# Patient Record
Sex: Female | Born: 1965
Health system: Southern US, Community
[De-identification: ages and names within clinical notes are randomized; demographics above are authoritative.]

## PROBLEM LIST (undated history)

## (undated) DIAGNOSIS — R87619 Unspecified abnormal cytological findings in specimens from cervix uteri: Secondary | ICD-10-CM

## (undated) DIAGNOSIS — I10 Essential (primary) hypertension: Secondary | ICD-10-CM

## (undated) DIAGNOSIS — N390 Urinary tract infection, site not specified: Secondary | ICD-10-CM

## (undated) DIAGNOSIS — E785 Hyperlipidemia, unspecified: Secondary | ICD-10-CM

## (undated) HISTORY — DX: Unspecified abnormal cytological findings in specimens from cervix uteri: R87.619

## (undated) HISTORY — DX: Hyperlipidemia, unspecified: E78.5

## (undated) HISTORY — PX: BREAST ENHANCEMENT SURGERY: SHX7

## (undated) HISTORY — DX: Essential (primary) hypertension: I10

## (undated) HISTORY — DX: Urinary tract infection, site not specified: N39.0

## (undated) HISTORY — PX: AUGMENTATION MAMMAPLASTY: SUR837

---

## 1994-03-14 DIAGNOSIS — R87619 Unspecified abnormal cytological findings in specimens from cervix uteri: Secondary | ICD-10-CM

## 1994-03-14 HISTORY — PX: CRYOTHERAPY: SHX1416

## 1994-03-14 HISTORY — DX: Unspecified abnormal cytological findings in specimens from cervix uteri: R87.619

## 2005-05-24 ENCOUNTER — Other Ambulatory Visit: Admission: RE | Admit: 2005-05-24 | Discharge: 2005-05-24 | Payer: Self-pay | Admitting: Obstetrics and Gynecology

## 2005-07-01 ENCOUNTER — Encounter: Admission: RE | Admit: 2005-07-01 | Discharge: 2005-07-01 | Payer: Self-pay | Admitting: Obstetrics and Gynecology

## 2006-05-25 ENCOUNTER — Other Ambulatory Visit: Admission: RE | Admit: 2006-05-25 | Discharge: 2006-05-25 | Payer: Self-pay | Admitting: Obstetrics and Gynecology

## 2006-07-04 ENCOUNTER — Encounter: Admission: RE | Admit: 2006-07-04 | Discharge: 2006-07-04 | Payer: Self-pay | Admitting: Obstetrics and Gynecology

## 2007-07-13 ENCOUNTER — Encounter: Admission: RE | Admit: 2007-07-13 | Discharge: 2007-07-13 | Payer: Self-pay | Admitting: Family Medicine

## 2008-07-28 ENCOUNTER — Encounter: Admission: RE | Admit: 2008-07-28 | Discharge: 2008-07-28 | Payer: Self-pay | Admitting: Family Medicine

## 2009-11-10 ENCOUNTER — Encounter: Admission: RE | Admit: 2009-11-10 | Discharge: 2009-11-10 | Payer: Self-pay | Admitting: Obstetrics and Gynecology

## 2010-10-26 ENCOUNTER — Other Ambulatory Visit: Payer: Self-pay | Admitting: Obstetrics and Gynecology

## 2010-10-26 DIAGNOSIS — Z1231 Encounter for screening mammogram for malignant neoplasm of breast: Secondary | ICD-10-CM

## 2010-11-30 ENCOUNTER — Ambulatory Visit: Payer: Self-pay

## 2010-12-21 ENCOUNTER — Ambulatory Visit
Admission: RE | Admit: 2010-12-21 | Discharge: 2010-12-21 | Disposition: A | Discharge status: Home / Self Care | Payer: Self-pay | Source: Ambulatory Visit | Attending: Obstetrics and Gynecology | Admitting: Obstetrics and Gynecology

## 2010-12-21 DIAGNOSIS — Z1231 Encounter for screening mammogram for malignant neoplasm of breast: Secondary | ICD-10-CM

## 2011-01-11 ENCOUNTER — Ambulatory Visit: Payer: Self-pay

## 2011-11-07 ENCOUNTER — Other Ambulatory Visit: Payer: Self-pay | Admitting: Nephrology

## 2011-11-07 DIAGNOSIS — I10 Essential (primary) hypertension: Secondary | ICD-10-CM

## 2011-11-09 ENCOUNTER — Other Ambulatory Visit: Payer: BC Managed Care – PPO

## 2011-11-11 ENCOUNTER — Ambulatory Visit (INDEPENDENT_AMBULATORY_CARE_PROVIDER_SITE_OTHER): Payer: BC Managed Care – PPO

## 2011-11-11 DIAGNOSIS — I10 Essential (primary) hypertension: Secondary | ICD-10-CM

## 2011-11-11 DIAGNOSIS — R799 Abnormal finding of blood chemistry, unspecified: Secondary | ICD-10-CM

## 2011-11-21 ENCOUNTER — Other Ambulatory Visit: Payer: Self-pay | Admitting: Nurse Practitioner

## 2011-11-21 ENCOUNTER — Other Ambulatory Visit (HOSPITAL_BASED_OUTPATIENT_CLINIC_OR_DEPARTMENT_OTHER): Payer: Self-pay | Admitting: Nurse Practitioner

## 2011-11-21 ENCOUNTER — Other Ambulatory Visit (HOSPITAL_BASED_OUTPATIENT_CLINIC_OR_DEPARTMENT_OTHER): Payer: Self-pay | Admitting: Obstetrics and Gynecology

## 2011-11-21 DIAGNOSIS — Z1231 Encounter for screening mammogram for malignant neoplasm of breast: Secondary | ICD-10-CM

## 2011-12-23 ENCOUNTER — Inpatient Hospital Stay (HOSPITAL_BASED_OUTPATIENT_CLINIC_OR_DEPARTMENT_OTHER): Admission: RE | Admit: 2011-12-23 | Payer: BC Managed Care – PPO | Source: Ambulatory Visit

## 2011-12-23 ENCOUNTER — Ambulatory Visit: Payer: BC Managed Care – PPO

## 2011-12-28 ENCOUNTER — Ambulatory Visit (HOSPITAL_BASED_OUTPATIENT_CLINIC_OR_DEPARTMENT_OTHER)
Admission: RE | Admit: 2011-12-28 | Discharge: 2011-12-28 | Disposition: A | Discharge status: Home / Self Care | Payer: BC Managed Care – PPO | Source: Ambulatory Visit | Attending: Obstetrics and Gynecology | Admitting: Obstetrics and Gynecology

## 2011-12-28 DIAGNOSIS — Z1231 Encounter for screening mammogram for malignant neoplasm of breast: Secondary | ICD-10-CM | POA: Insufficient documentation

## 2012-12-13 ENCOUNTER — Other Ambulatory Visit (HOSPITAL_BASED_OUTPATIENT_CLINIC_OR_DEPARTMENT_OTHER): Payer: Self-pay | Admitting: Nurse Practitioner

## 2012-12-13 DIAGNOSIS — Z1231 Encounter for screening mammogram for malignant neoplasm of breast: Secondary | ICD-10-CM

## 2013-01-01 ENCOUNTER — Ambulatory Visit: Payer: BC Managed Care – PPO

## 2013-01-01 DIAGNOSIS — Z1231 Encounter for screening mammogram for malignant neoplasm of breast: Secondary | ICD-10-CM

## 2013-11-28 ENCOUNTER — Other Ambulatory Visit: Payer: Self-pay | Admitting: Nurse Practitioner

## 2013-11-28 DIAGNOSIS — Z1231 Encounter for screening mammogram for malignant neoplasm of breast: Secondary | ICD-10-CM

## 2014-01-02 ENCOUNTER — Ambulatory Visit: Payer: BC Managed Care – PPO

## 2014-01-02 DIAGNOSIS — Z1231 Encounter for screening mammogram for malignant neoplasm of breast: Secondary | ICD-10-CM

## 2014-02-15 DIAGNOSIS — E785 Hyperlipidemia, unspecified: Secondary | ICD-10-CM

## 2014-02-15 HISTORY — DX: Hyperlipidemia, unspecified: E78.5

## 2014-05-19 LAB — HM COLONOSCOPY

## 2014-08-23 DIAGNOSIS — I10 Essential (primary) hypertension: Secondary | ICD-10-CM

## 2014-08-23 HISTORY — DX: Essential (primary) hypertension: I10

## 2014-12-12 ENCOUNTER — Other Ambulatory Visit: Payer: Self-pay | Admitting: Nurse Practitioner

## 2014-12-12 DIAGNOSIS — Z1231 Encounter for screening mammogram for malignant neoplasm of breast: Secondary | ICD-10-CM

## 2015-01-07 ENCOUNTER — Other Ambulatory Visit: Payer: Self-pay | Admitting: Colon and Rectal Surgery

## 2015-01-07 ENCOUNTER — Ambulatory Visit: Payer: BLUE CROSS/BLUE SHIELD

## 2015-01-07 DIAGNOSIS — Z1231 Encounter for screening mammogram for malignant neoplasm of breast: Secondary | ICD-10-CM

## 2015-05-04 ENCOUNTER — Ambulatory Visit: Payer: BLUE CROSS/BLUE SHIELD | Admitting: Osteopathic Medicine

## 2015-05-11 ENCOUNTER — Ambulatory Visit (INDEPENDENT_AMBULATORY_CARE_PROVIDER_SITE_OTHER): Payer: BLUE CROSS/BLUE SHIELD | Admitting: Obstetrics & Gynecology

## 2015-05-11 ENCOUNTER — Encounter: Payer: Self-pay | Admitting: Obstetrics & Gynecology

## 2015-05-11 VITALS — BP 139/89 | HR 94 | Resp 16 | Ht 67.0 in | Wt 200.0 lb

## 2015-05-11 DIAGNOSIS — Z01419 Encounter for gynecological examination (general) (routine) without abnormal findings: Secondary | ICD-10-CM

## 2015-05-11 DIAGNOSIS — Z1151 Encounter for screening for human papillomavirus (HPV): Secondary | ICD-10-CM

## 2015-05-11 DIAGNOSIS — Z124 Encounter for screening for malignant neoplasm of cervix: Secondary | ICD-10-CM | POA: Diagnosis not present

## 2015-05-11 DIAGNOSIS — N938 Other specified abnormal uterine and vaginal bleeding: Secondary | ICD-10-CM | POA: Diagnosis not present

## 2015-05-11 MED ORDER — MISOPROSTOL 200 MCG PO TABS: ORAL_TABLET | ORAL | Status: DC

## 2015-05-11 NOTE — Progress Notes (Signed)
Subjective:    Lorraine Patton is a 50 y.o. MW P2 (42 and 39 yo sons)  female who presents for an annual exam. Her periods are irregular now. She occasionally skips a period and now she has been bleeding for the last month. The patient is sexually active. GYN screening history: last pap: was normal. The patient wears seatbelts: yes. The patient participates in regular exercise: yes. Has the patient ever been transfused or tattooed?: yes. The patient reports that there is not domestic violence in her life.   Menstrual History: OB History    Gravida Para Term Preterm AB TAB SAB Ectopic Multiple Living   Menarche age: 73  Patient's last menstrual period was 04/20/2015.    The following portions of the patient's history were reviewed and updated as appropriate: allergies, current medications, past family history, past medical history, past social history, past surgical history and problem list.  Review of Systems Pertinent items noted in HPI and remainder of comprehensive ROS otherwise negative.  Mammo normal 10/17, No FH of breast cancer. Had flu vaccine this season. Colonoscopy normal at 49 yo. Denies dyspareunia, some dryness. Cryo in 1997    Objective:    BP 139/89 mmHg  Pulse 94  Resp 16  Ht  (1.702 m)  Wt 200 lb (90.719 kg)  BMI 31.32 kg/m2  LMP 04/20/2015  General Appearance:    Alert, cooperative, no distress, appears stated age  Head:    Normocephalic, without obvious abnormality, atraumatic  Eyes:    PERRL, conjunctiva/corneas clear, EOM's intact, fundi    benign, both eyes  Ears:    Normal TM's and external ear canals, both ears  Nose:   Nares normal, septum midline, mucosa normal, no drainage    or sinus tenderness  Throat:   Lips, mucosa, and tongue normal; teeth and gums normal  Neck:   Supple, symmetrical, trachea midline, no adenopathy;    thyroid:  no enlargement/tenderness/nodules; no carotid   bruit or JVD  Back:     Symmetric, no curvature,  ROM normal, no CVA tenderness  Lungs:     Clear to auscultation bilaterally, respirations unlabored  Chest Wall:    No tenderness or deformity   Heart:    Regular rate and rhythm, S1 and S2 normal, no murmur, rub   or gallop  Breast Exam:    No tenderness, masses, or nipple abnormality  Abdomen:     Soft, non-tender, bowel sounds active all four quadrants,    no masses, no organomegaly  Genitalia:    Normal female without lesion, discharge or tenderness, NSSmid plane, NT, mobile, normal adnexal exam     Extremities:   Extremities normal, atraumatic, no cyanosis or edema  Pulses:   2+ and symmetric all extremities  Skin:   Skin color, texture, turgor normal, no rashes or lesions  Lymph nodes:   Cervical, supraclavicular, and axillary nodes normal  Neurologic:   CNII-XII intact, normal strength, sensation and reflexes    throughout  .    Assessment:    Healthy female exam.   DUB  Plan:     Thin prep Pap smear. with cotesting Gyn u/s Possible EMBX (cytotec prescribed)

## 2015-05-13 ENCOUNTER — Other Ambulatory Visit: Payer: BLUE CROSS/BLUE SHIELD

## 2015-05-15 LAB — CYTOLOGY - PAP

## 2015-05-18 ENCOUNTER — Ambulatory Visit: Payer: BLUE CROSS/BLUE SHIELD | Admitting: Obstetrics & Gynecology

## 2015-05-19 ENCOUNTER — Ambulatory Visit (INDEPENDENT_AMBULATORY_CARE_PROVIDER_SITE_OTHER): Payer: BLUE CROSS/BLUE SHIELD

## 2015-05-19 ENCOUNTER — Other Ambulatory Visit: Payer: BLUE CROSS/BLUE SHIELD

## 2015-05-19 DIAGNOSIS — N938 Other specified abnormal uterine and vaginal bleeding: Secondary | ICD-10-CM

## 2015-05-19 LAB — COMPREHENSIVE METABOLIC PANEL
ALBUMIN: 4 g/dL (ref 3.6–5.1)
ALT: 18 U/L (ref 6–29)
AST: 16 U/L (ref 10–35)
Alkaline Phosphatase: 52 U/L (ref 33–130)
BUN: 17 mg/dL (ref 7–25)
CHLORIDE: 103 mmol/L (ref 98–110)
CO2: 25 mmol/L (ref 20–31)
CREATININE: 1.02 mg/dL (ref 0.50–1.05)
Calcium: 9 mg/dL (ref 8.6–10.4)
GLUCOSE: 88 mg/dL (ref 65–99)
Potassium: 4.5 mmol/L (ref 3.5–5.3)
SODIUM: 136 mmol/L (ref 135–146)
Total Bilirubin: 0.4 mg/dL (ref 0.2–1.2)
Total Protein: 6.7 g/dL (ref 6.1–8.1)

## 2015-05-19 LAB — CBC
HCT: 41.1 % (ref 36.0–46.0)
HEMOGLOBIN: 14.1 g/dL (ref 12.0–15.0)
MCH: 30.1 pg (ref 26.0–34.0)
MCHC: 34.3 g/dL (ref 30.0–36.0)
MCV: 87.8 fL (ref 78.0–100.0)
MPV: 9.2 fL (ref 8.6–12.4)
Platelets: 294 10*3/uL (ref 150–400)
RBC: 4.68 MIL/uL (ref 3.87–5.11)
RDW: 13.4 % (ref 11.5–15.5)
WBC: 6 10*3/uL (ref 4.0–10.5)

## 2015-05-19 LAB — LIPID PANEL
CHOL/HDL RATIO: 4.6 ratio (ref <=5.0)
Cholesterol: 184 mg/dL (ref 125–200)
HDL: 40 mg/dL — AB (ref >=46)
LDL CALC: 110 mg/dL (ref <130)
Triglycerides: 171 mg/dL — ABNORMAL HIGH (ref <150)
VLDL: 34 mg/dL — AB (ref <30)

## 2015-05-19 LAB — TSH: TSH: 1.94 m[IU]/L

## 2015-05-20 LAB — VITAMIN D 25 HYDROXY (VIT D DEFICIENCY, FRACTURES): Vit D, 25-Hydroxy: 33 ng/mL (ref 30–100)

## 2015-05-21 ENCOUNTER — Encounter: Payer: Self-pay | Admitting: Obstetrics & Gynecology

## 2015-05-21 ENCOUNTER — Ambulatory Visit (INDEPENDENT_AMBULATORY_CARE_PROVIDER_SITE_OTHER): Payer: BLUE CROSS/BLUE SHIELD | Admitting: Obstetrics & Gynecology

## 2015-05-21 VITALS — BP 158/92 | HR 112 | Resp 16 | Ht 67.0 in | Wt 200.0 lb

## 2015-05-21 DIAGNOSIS — N924 Excessive bleeding in the premenopausal period: Secondary | ICD-10-CM

## 2015-05-21 DIAGNOSIS — Z01812 Encounter for preprocedural laboratory examination: Secondary | ICD-10-CM

## 2015-05-21 LAB — POCT URINE PREGNANCY: Preg Test, Ur: NEGATIVE

## 2015-05-21 NOTE — Progress Notes (Signed)
   Subjective:    Patient ID: Lorraine PostSusan Brechtel, female    DOB: 11/01/1965, 50 y.o.   MRN: [id number]  HPI  50 yo [facility] P[facility] has [facility]UB and her u/s showed a 16 mm lining with no fibroids.  Review of Systems     Objective:   Physical Exam WNWHWFNAD Breathing, conversing, and ambulating normally  UPT negative, consent signed, time out done Cervix prepped with betadine and grasped with a single tooth tenaculum Uterus sounded to 9 cm Pipelle used for 2 passes with a moderate amount of tissue obtained. She tolerated the procedure well.     Assessment & Plan:  DUB- wait EMBX Discussed options including watchful waiting, Mirena, cyclic provera, and/or d&c/endometrial ablation

## 2015-05-25 ENCOUNTER — Encounter: Payer: Self-pay | Admitting: Osteopathic Medicine

## 2015-05-25 ENCOUNTER — Ambulatory Visit (INDEPENDENT_AMBULATORY_CARE_PROVIDER_SITE_OTHER): Payer: BLUE CROSS/BLUE SHIELD | Admitting: Osteopathic Medicine

## 2015-05-25 VITALS — BP 134/73 | HR 96 | Ht 67.0 in | Wt 202.0 lb

## 2015-05-25 DIAGNOSIS — Z7189 Other specified counseling: Secondary | ICD-10-CM | POA: Diagnosis not present

## 2015-05-25 DIAGNOSIS — R030 Elevated blood-pressure reading, without diagnosis of hypertension: Secondary | ICD-10-CM | POA: Diagnosis not present

## 2015-05-25 DIAGNOSIS — I10 Essential (primary) hypertension: Secondary | ICD-10-CM | POA: Insufficient documentation

## 2015-05-25 NOTE — Progress Notes (Signed)
HPI: Lorraine Patton is a 50 y.o. female who presents to [facility] today for chief complaint of:  Chief Complaint  Patient presents with  . Establish Care    Concerned about previous diagnosis of hypertension    Previously under care of Dr. Harvie Heck at [facility] last OV 12/2014 for HTN followup, was on HCTZ 12.5. Works as Chief Executive Officer. Looking to establish care here.  HTN: Initially diagnosed with hypertension after blood pressure was 150s over 100s when she was going into have MRI injection of the hip, her PCP placed her on low-dose hydrochlorothiazide at that point, she is in on this medicine on and off for a few years. Patient is concerned she may have more of a white coat hypertension than true blood pressure problems, she gets very nervous at [employer]. Was a bit dizzy on HCTZ, started taking every other day. Takes BP at home and measurements were normal. Has been off for about 8 weeks or so. Has home arm BP cuff, taking BP appropriately, taking R and L measurements. No FH 1st deg relatives w/ significant CAD.   FH: Sister deceased - hx myelodysplasitc syndrome. Brother deceased - AIDS. Father deceased - plane crash.      Past medical, social and family history reviewed: Past Medical History  Diagnosis Date  . Abnormal Pap smear of cervix 1996  . Essential (primary) hypertension 08/23/2014  . HLD (hyperlipidemia) 02/15/2014   Past Surgical History  Procedure Laterality Date  . Cryotherapy  1996  . Breast enhancement surgery  2016 and 1997   Social History  Substance Use Topics  . Smoking status: Never Smoker   . Smokeless tobacco: Not on file  . Alcohol Use: 0.0 oz/week    0 Standard drinks or equivalent per week   Family History  Problem Relation Age of Onset  . Myelodysplastic syndrome Sister   . HIV/AIDS Brother   . Diabetes Maternal Grandmother   . Heart disease Maternal Grandfather     No current outpatient prescriptions  on file.   No current facility-administered medications for this visit.   No Known Allergies    Review of Systems: CONSTITUTIONAL:  No  fever, no chills, No  unintentional weight changes HEAD/EYES/EARS/NOSE/THROAT: No  headache, no vision change, no hearing change, No  sore throat, No  sinus pressure CARDIAC: No  chest pain, No  pressure, No palpitations, No  orthopnea RESPIRATORY: No  cough, No  shortness of breath/wheeze GASTROINTESTINAL: No  nausea, No  vomiting, No  abdominal pain, No  blood in stool, No  diarrhea, No  constipation  MUSCULOSKELETAL: No  myalgia/arthralgia GENITOURINARY: No  incontinence, No  abnormal genital bleeding/discharge SKIN: No  rash/wounds/concerning lesions HEM/ONC: No  easy bruising/bleeding, No  abnormal lymph node ENDOCRINE: No polyuria/polydipsia/polyphagia, No  heat/cold intolerance  NEUROLOGIC: No  weakness, No  dizziness, No  slurred speech PSYCHIATRIC: No  concerns with depression, No  concerns with anxiety, No sleep problems  Exam:  BP 134/73 mmHg  Pulse 96  Ht  (1.702 m)  Wt 202 lb (91.627 kg)  BMI 31.63 kg/m2  LMP 04/20/2015 Constitutional: VS see above. General Appearance: alert, well-developed, well-nourished, NAD Eyes: Normal lids and conjunctive, non-icteric sclera,  Ears, Nose, Mouth, Throat: MMM, Normal external inspection ears/nares/mouth/lips/gums, Respiratory: Normal respiratory effort. no wheeze, no rhonchi, no rales Cardiovascular: S1/S2 normal, no murmur, no rub/gallop auscultated. RRR. No lower extremity edema. Psychiatric: Normal judgment/insight. Normal mood and affect. Oriented x3. PHQ2 neg.  No results found for this or any previous visit (from the past 72 hour(s)).  Labs reviewed via Care Everywhere: 12/19/14 CMP reduced GFR 52, Cr 1.1, TC 224, HDL 52, LDL 145, Glc 85.  Labs reviewed which were recently ordered by GYN, lipids borderline, creatinine mildly elevated but GFR in the 80s   ASSESSMENT/PLAN: Based  on overall risk factors, home blood pressure measurements (though her cuff does need to be verified in office) and side effects from antihypertension medications, would probably not diagnosed this patient with essential hypertension at this time but patient will require relatively close follow-up to monitor. ER/RTC precautions reviewed.  White coat syndrome without diagnosis of hypertension - Home blood pressure readings typically 120s to 130s over 80s. At next clinic visit, bring home BP cuff.  Cardiac risk counseling - 10 year ASCVD risk 1.9%, can make improvements on cholesterol but not in statin benefit group   Return in about 6 months (around 11/25/2015), or sooner if needed, for ANNUAL WELLNESS EXAM, BP AND LAB RECHECK.

## 2015-05-25 NOTE — Patient Instructions (Addendum)
Let's plan to follow up in the office to recheck labs and blood pressure in 6 months. Bring your home blood pressure cuff to that appointment. Call us a week or so before that appointment, I can put in the lab orders for cholesterol and kidney function recheck, you can get this blood draw done ahead of time, that way we can go over the results at your appointment.   Any other questions or concerns, please call the office!  Take care! -Dr. Mervyn SkeetersA.

## 2015-06-01 ENCOUNTER — Ambulatory Visit (INDEPENDENT_AMBULATORY_CARE_PROVIDER_SITE_OTHER): Payer: BLUE CROSS/BLUE SHIELD | Admitting: Obstetrics & Gynecology

## 2015-06-01 ENCOUNTER — Encounter: Payer: Self-pay | Admitting: Obstetrics & Gynecology

## 2015-06-01 VITALS — BP 128/84 | HR 85 | Resp 16 | Ht 67.0 in | Wt 200.0 lb

## 2015-06-01 DIAGNOSIS — N938 Other specified abnormal uterine and vaginal bleeding: Secondary | ICD-10-CM

## 2015-06-01 NOTE — Progress Notes (Signed)
   Subjective:    Patient ID: Lorraine PostSusan Patton, female    DOB: 10/12/1965, 50 y.o.   MRN: [id number]  HPI  50 yo perimenopausal lady here for follow up of her EMBX results. She has finally, after 10 weeks, stopped bleeding. Renee RivalYay! Her pathology was negative.  Review of Systems     Objective:   Physical Exam WNWHWFNAD Breathing, conversing and ambulating normally       Assessment & Plan:  DUB-I have offered endometrial ablation versus OCPs versus cyclic progestin. She will let me know.

## 2015-06-05 ENCOUNTER — Encounter: Payer: Self-pay | Admitting: Obstetrics & Gynecology

## 2015-06-05 DIAGNOSIS — N938 Other specified abnormal uterine and vaginal bleeding: Secondary | ICD-10-CM

## 2015-06-08 MED ORDER — NORGESTREL-ETHINYL ESTRADIOL 0.3-30 MG-MCG PO TABS: 1.0000 | ORAL_TABLET | Freq: Every day | ORAL | Status: DC

## 2015-11-18 ENCOUNTER — Encounter: Payer: Self-pay | Admitting: Osteopathic Medicine

## 2015-11-18 DIAGNOSIS — Z0000 Encounter for general adult medical examination without abnormal findings: Secondary | ICD-10-CM

## 2015-11-19 ENCOUNTER — Other Ambulatory Visit: Payer: Self-pay | Admitting: *Deleted

## 2015-11-19 DIAGNOSIS — Z0000 Encounter for general adult medical examination without abnormal findings: Secondary | ICD-10-CM

## 2015-11-20 LAB — CBC WITH DIFFERENTIAL/PLATELET
BASOS ABS: 0 {cells}/uL (ref 0–200)
Basophils Relative: 0 %
EOS ABS: 176 {cells}/uL (ref 15–500)
Eosinophils Relative: 2 %
HEMATOCRIT: 40.9 % (ref 35.0–45.0)
Hemoglobin: 13.7 g/dL (ref 11.7–15.5)
LYMPHS PCT: 25 %
Lymphs Abs: 2200 cells/uL (ref 850–3900)
MCH: 29.7 pg (ref 27.0–33.0)
MCHC: 33.5 g/dL (ref 32.0–36.0)
MCV: 88.7 fL (ref 80.0–100.0)
MONO ABS: 528 {cells}/uL (ref 200–950)
MPV: 9.8 fL (ref 7.5–12.5)
Monocytes Relative: 6 %
NEUTROS PCT: 67 %
Neutro Abs: 5896 cells/uL (ref 1500–7800)
Platelets: 312 10*3/uL (ref 140–400)
RBC: 4.61 MIL/uL (ref 3.80–5.10)
RDW: 14.7 % (ref 11.0–15.0)
WBC: 8.8 10*3/uL (ref 3.8–10.8)

## 2015-11-20 LAB — COMPLETE METABOLIC PANEL WITH GFR
ALT: 23 U/L (ref 6–29)
AST: 17 U/L (ref 10–35)
Albumin: 3.9 g/dL (ref 3.6–5.1)
Alkaline Phosphatase: 34 U/L (ref 33–130)
BUN: 13 mg/dL (ref 7–25)
CALCIUM: 9.1 mg/dL (ref 8.6–10.4)
CHLORIDE: 106 mmol/L (ref 98–110)
CO2: 23 mmol/L (ref 20–31)
Creat: 1.13 mg/dL — ABNORMAL HIGH (ref 0.50–1.05)
GFR, EST AFRICAN AMERICAN: 65 mL/min (ref >=60)
GFR, Est Non African American: 56 mL/min — ABNORMAL LOW (ref >=60)
Glucose, Bld: 81 mg/dL (ref 65–99)
POTASSIUM: 4.3 mmol/L (ref 3.5–5.3)
Sodium: 138 mmol/L (ref 135–146)
Total Bilirubin: 0.5 mg/dL (ref 0.2–1.2)
Total Protein: 6.8 g/dL (ref 6.1–8.1)

## 2015-11-20 LAB — LIPID PANEL
CHOL/HDL RATIO: 4.1 ratio (ref <=5.0)
CHOLESTEROL: 196 mg/dL (ref 125–200)
HDL: 48 mg/dL (ref >=46)
LDL Cholesterol: 117 mg/dL (ref <130)
TRIGLYCERIDES: 156 mg/dL — AB (ref <150)
VLDL: 31 mg/dL — AB (ref <30)

## 2015-11-20 LAB — HEPATITIS C ANTIBODY: HCV Ab: NEGATIVE

## 2015-11-20 LAB — HIV ANTIBODY (ROUTINE TESTING W REFLEX): HIV 1&2 Ab, 4th Generation: NONREACTIVE

## 2015-11-25 ENCOUNTER — Encounter: Payer: BLUE CROSS/BLUE SHIELD | Admitting: Osteopathic Medicine

## 2015-12-02 ENCOUNTER — Ambulatory Visit (INDEPENDENT_AMBULATORY_CARE_PROVIDER_SITE_OTHER): Payer: BLUE CROSS/BLUE SHIELD | Admitting: Osteopathic Medicine

## 2015-12-02 ENCOUNTER — Encounter: Payer: Self-pay | Admitting: Osteopathic Medicine

## 2015-12-02 VITALS — BP 141/80 | HR 93 | Ht 67.0 in | Wt 200.0 lb

## 2015-12-02 DIAGNOSIS — N289 Disorder of kidney and ureter, unspecified: Secondary | ICD-10-CM

## 2015-12-02 DIAGNOSIS — Z23 Encounter for immunization: Secondary | ICD-10-CM | POA: Diagnosis not present

## 2015-12-02 DIAGNOSIS — Z7189 Other specified counseling: Secondary | ICD-10-CM | POA: Diagnosis not present

## 2015-12-02 DIAGNOSIS — Z0000 Encounter for general adult medical examination without abnormal findings: Secondary | ICD-10-CM | POA: Diagnosis not present

## 2015-12-02 DIAGNOSIS — R030 Elevated blood-pressure reading, without diagnosis of hypertension: Secondary | ICD-10-CM

## 2015-12-02 NOTE — Patient Instructions (Signed)
Plan to repeat labs to follow-up on kidney function in 3 months and to recheck blood pressure. Be sure to bring your home BP cuff to that visit! Lab orders are in, if you'd like to get blood work and urine test done ahead of time.

## 2015-12-02 NOTE — Progress Notes (Signed)
HPI: Lorraine Patton is a 50 y.o. female  who presents to [facility] today, 12/02/15,  for chief complaint of:  Chief Complaint  Patient presents with  . Annual Exam    Preventive care reviewed as below   HTN/White Coat - didn't bring home BP cuff today. No CP/SOB.   Past medical, surgical, social and family history reviewed: Past Medical History:  Diagnosis Date  . Abnormal Pap smear of cervix 1996  . Essential (primary) hypertension 08/23/2014  . HLD (hyperlipidemia) 02/15/2014   Past Surgical History:  Procedure Laterality Date  . BREAST ENHANCEMENT SURGERY  2016 and 1997  . CRYOTHERAPY  1996   Social History  Substance Use Topics  . Smoking status: Never Smoker  . Smokeless tobacco: Not on file  . Alcohol use 0.0 oz/week   Family History  Problem Relation Age of Onset  . Myelodysplastic syndrome Sister   . Cancer Sister   . HIV/AIDS Brother   . Diabetes Maternal Grandmother   . Heart disease Maternal Grandfather   . Hyperlipidemia Maternal Uncle      Current medication list and allergy/intolerance information reviewed:   Current Outpatient Prescriptions  Medication Sig Dispense Refill  . norgestrel-ethinyl estradiol (LO/OVRAL,CRYSELLE) 0.3-30 MG-MCG tablet Take 1 tablet by mouth daily. 1 Package 11   No current facility-administered medications for this visit.    No Known Allergies    Review of Systems:  Constitutional:  No  fever, no chills, No recent illness, No unintentional weight changes. No significant fatigue.   HEENT: No  headache, no vision change  Cardiac: No  chest pain, No  pressure, No palpitations  Respiratory:  No  shortness of breath. No  Cough  Gastrointestinal: No  abdominal pain, No  nausea, No  vomiting,  No  blood in stool, No  diarrhea, No  constipation   Musculoskeletal: No new myalgia/arthralgia  Genitourinary: No  abnormal genital bleeding, No abnormal genital discharge  Skin: No  Rash, No  other wounds/concerning lesions  Neurologic: No  weakness, No  dizziness  Psychiatric: No  concerns with depression, No  concerns with anxiety  Exam:  BP (!) 141/80   Pulse 93   Ht _0  (1.702 m)   Wt 200 lb (90.7 kg)   BMI 31.32 kg/m   Constitutional: VS see above. General Appearance: alert, well-developed, well-nourished, NAD  Eyes: Normal lids and conjunctive, non-icteric sclera  Ears, Nose, Mouth, Throat: MMM, Normal external inspection ears/nares/mouth/lips/gums. TM normal bilaterally. Pharynx/tonsils no erythema, no exudate. Nasal mucosa normal.   Neck: No masses, trachea midline. No thyroid enlargement. No tenderness/mass appreciated. No lymphadenopathy  Respiratory: Normal respiratory effort. no wheeze, no rhonchi, no rales  Cardiovascular: S1/S2 normal, no murmur, no rub/gallop auscultated. RRR. No lower extremity edema.   Gastrointestinal: Nontender, no masses. No hepatomegaly, no splenomegaly. No hernia appreciated. Bowel sounds normal. Rectal exam deferred.   Musculoskeletal: Gait normal. No clubbing/cyanosis of digits.   Neurological: No cranial nerve deficit on limited exam. Motor and sensation intact and symmetric. Cerebellar reflexes intact. Normal balance/coordination. No tremor.   Skin: warm, dry, intact. No rash/ulcer.  Psychiatric: Normal judgment/insight. Normal mood and affect. Oriented x3.    Recent Results (from the past 2160 hour(s))  CBC with Differential/Platelet     Status: None   Collection Time: 11/19/15 12:31 PM  Result Value Ref Range   WBC 8.8 3.8 - 10.8 K/uL   RBC 4.61 3.80 - 5.10 MIL/uL   Hemoglobin 13.7 11.7 - 15.5  g/dL   HCT 40.9 35.0 - 45.0 %   MCV 88.7 80.0 - 100.0 fL   MCH 29.7 27.0 - 33.0 pg   MCHC 33.5 32.0 - 36.0 g/dL   RDW 14.7 11.0 - 15.0 %   Platelets 312 140 - 400 K/uL   MPV 9.8 7.5 - 12.5 fL   Neutro Abs 5,896 1,500 - 7,800 cells/uL   Lymphs Abs 2,200 850 - 3,900 cells/uL   Monocytes Absolute 528 200 - 950 cells/uL    Eosinophils Absolute 176 15 - 500 cells/uL   Basophils Absolute 0 0 - 200 cells/uL   Neutrophils Relative % 67 %   Lymphocytes Relative 25 %   Monocytes Relative 6 %   Eosinophils Relative 2 %   Basophils Relative 0 %   Smear Review Criteria for review not met   COMPLETE METABOLIC PANEL WITH GFR     Status: Abnormal   Collection Time: 11/19/15 12:31 PM  Result Value Ref Range   Sodium 138 135 - 146 mmol/L   Potassium 4.3 3.5 - 5.3 mmol/L   Chloride 106 98 - 110 mmol/L   CO2 23 20 - 31 mmol/L   Glucose, Bld 81 65 - 99 mg/dL   BUN 13 7 - 25 mg/dL   Creat 1.13 (H) 0.50 - 1.05 mg/dL    Comment:   For patients > or = 52 years of age: The upper reference limit for Creatinine is approximately 13% higher for people identified as African-American.      Total Bilirubin 0.5 0.2 - 1.2 mg/dL   Alkaline Phosphatase 34 33 - 130 U/L   AST 17 10 - 35 U/L   ALT 23 6 - 29 U/L   Total Protein 6.8 6.1 - 8.1 g/dL   Albumin 3.9 3.6 - 5.1 g/dL   Calcium 9.1 8.6 - 10.4 mg/dL   GFR, Est African American 65 >=60 mL/min   GFR, Est Non African American 56 (L) >=60 mL/min  Lipid panel     Status: Abnormal   Collection Time: 11/19/15 12:31 PM  Result Value Ref Range   Cholesterol 196 125 - 200 mg/dL   Triglycerides 156 (H) <150 mg/dL   HDL 48 >=46 mg/dL   Total CHOL/HDL Ratio 4.1 <=5.0 Ratio   VLDL 31 (H) <30 mg/dL   LDL Cholesterol 117 <130 mg/dL    Comment:   Total Cholesterol/HDL Ratio:CHD Risk                        Coronary Heart Disease Risk Table                                        Men       Women          1/2 Average Risk              3.4        3.3              Average Risk              5.0        4.4           2X Average Risk              9.6        7.1  3X Average Risk             23.4       11.0 Use the calculated Patient Ratio above and the CHD Risk table  to determine the patient's CHD Risk.   HIV antibody     Status: None   Collection Time: 11/19/15 12:31 PM   Result Value Ref Range   HIV 1&2 Ab, 4th Generation NONREACTIVE NONREACTIVE    Comment:   HIV-1 antigen and HIV-1/HIV-2 antibodies were not detected.  There is no laboratory evidence of HIV infection.   HIV-1/2 Antibody Diff        Not indicated. HIV-1 RNA, Qual TMA          Not indicated.     PLEASE NOTE: This information has been disclosed to you from records whose confidentiality may be protected by state law. If your state requires such protection, then the state law prohibits you from making any further disclosure of the information without the specific written consent of the person to whom it pertains, or as otherwise permitted by law. A general authorization for the release of medical or other information is NOT sufficient for this purpose.   The performance of this assay has not been clinically validated in patients less than 70 years old.   For additional information please refer to http://education.[employer].com/faq/FAQ106.  (This link is being provided for informational/educational purposes only.)     Hepatitis C antibody     Status: None   Collection Time: 11/19/15 12:31 PM  Result Value Ref Range   HCV Ab NEGATIVE NEGATIVE     ASSESSMENT/PLAN:   Annual physical exam  Need for prophylactic vaccination and inoculation against influenza - Plan: Flu Vaccine QUAD 36+ mos IM  White coat syndrome without diagnosis of hypertension  Cardiac risk counseling  Abnormal kidney function - Plan: BASIC METABOLIC PANEL WITH GFR, Urinalysis   Patient Instructions  Plan to repeat labs to follow-up on kidney function in 3 months and to recheck blood pressure. Be sure to bring your home BP cuff to that visit! Lab orders are in, if you'd like to get blood work and urine test done ahead of time.    FEMALE PREVENTIVE CARE  ANNUAL SCREENING/COUNSELING Tobacco - noNever  Alcohol - social drinker Diet/Exercise - HEALTHY HABITS DISCUSSED TO DECREASE CV RISK Depression  - PQH2 Negative Domestic violence concerns - no HTN SCREENING - SEE VITALS Vaccination status - SEE BELOW  SEXUAL HEALTH Sexually active in the past year - Yes with female. STI - The patient denies history of sexually transmitted disease. STI testing today? - no  INFECTIOUS DISEASE SCREENING HIV - all adults 15-65 - does not need GC/CT - sexually active - does not need HepC - DOB [birth year] - does not need TB - does not need  DISEASE SCREENING Lipid - does not need DM2 - does not need Osteoporosis - does not need  CANCER SCREENING Cervical - does not need Breast - does not need Lung - does not need Colon - does not need  ADULT VACCINATION Influenza - was given Td - already has HPV - was not indicated Zoster - was not indicated Pneumonia - was not indicated  OTHER Fall - exercise and Vit D age 35+ - does not need Consider ASA - age 53-59 - does not need - but need to confirm BP at home  Current 10-year ASCVD risk: 2.5% Lifetime risk: 39%    Visit summary with medication list and pertinent instructions  was printed for patient to review. All questions at time of visit were answered - patient instructed to contact office with any additional concerns. ER/RTC precautions were reviewed with the patient. Follow-up plan: Return in about 3 months (around 03/02/2016) for RECHECK BLOOD PRESSURE - Leland! Marland Kitchen

## 2015-12-22 ENCOUNTER — Other Ambulatory Visit (HOSPITAL_COMMUNITY): Payer: Self-pay | Admitting: Obstetrics & Gynecology

## 2015-12-22 DIAGNOSIS — Z1231 Encounter for screening mammogram for malignant neoplasm of breast: Secondary | ICD-10-CM

## 2016-01-11 ENCOUNTER — Ambulatory Visit
Admission: RE | Admit: 2016-01-11 | Discharge: 2016-01-11 | Disposition: A | Discharge status: Home / Self Care | Payer: BLUE CROSS/BLUE SHIELD | Source: Ambulatory Visit | Attending: Obstetrics & Gynecology | Admitting: Obstetrics & Gynecology

## 2016-01-11 DIAGNOSIS — Z1231 Encounter for screening mammogram for malignant neoplasm of breast: Secondary | ICD-10-CM

## 2016-02-27 ENCOUNTER — Encounter: Payer: Self-pay | Admitting: Osteopathic Medicine

## 2016-03-02 ENCOUNTER — Ambulatory Visit: Payer: BLUE CROSS/BLUE SHIELD | Admitting: Osteopathic Medicine

## 2016-03-29 ENCOUNTER — Ambulatory Visit: Payer: BLUE CROSS/BLUE SHIELD | Admitting: Osteopathic Medicine

## 2016-05-05 ENCOUNTER — Other Ambulatory Visit: Payer: Self-pay | Admitting: Obstetrics & Gynecology

## 2016-05-05 DIAGNOSIS — N938 Other specified abnormal uterine and vaginal bleeding: Secondary | ICD-10-CM

## 2016-05-12 ENCOUNTER — Telehealth: Payer: Self-pay | Admitting: *Deleted

## 2016-05-12 DIAGNOSIS — N938 Other specified abnormal uterine and vaginal bleeding: Secondary | ICD-10-CM

## 2016-05-12 MED ORDER — NORGESTREL-ETHINYL ESTRADIOL 0.3-30 MG-MCG PO TABS: 1.0000 | ORAL_TABLET | Freq: Every day | ORAL | 1 refills | Status: DC

## 2016-05-12 NOTE — Telephone Encounter (Signed)
Pt called requesting a RF on OCP.  She is due for her annual this month.  Allowed pt to have 2 RF's on OCP's until she is scheduled for annual.

## 2016-06-20 ENCOUNTER — Ambulatory Visit: Payer: BLUE CROSS/BLUE SHIELD | Admitting: Obstetrics & Gynecology

## 2016-06-20 DIAGNOSIS — N951 Menopausal and female climacteric states: Secondary | ICD-10-CM | POA: Diagnosis not present

## 2016-06-22 DIAGNOSIS — E039 Hypothyroidism, unspecified: Secondary | ICD-10-CM | POA: Diagnosis not present

## 2016-06-22 DIAGNOSIS — N951 Menopausal and female climacteric states: Secondary | ICD-10-CM | POA: Diagnosis not present

## 2016-06-30 ENCOUNTER — Telehealth: Payer: Self-pay

## 2016-06-30 DIAGNOSIS — N289 Disorder of kidney and ureter, unspecified: Secondary | ICD-10-CM

## 2016-06-30 LAB — URINALYSIS
Bilirubin Urine: NEGATIVE
GLUCOSE, UA: NEGATIVE
Ketones, ur: NEGATIVE
LEUKOCYTES UA: NEGATIVE
Nitrite: NEGATIVE
PH: 5.5 (ref 5.0–8.0)
Protein, ur: NEGATIVE
Specific Gravity, Urine: 1.009 (ref 1.001–1.035)

## 2016-06-30 LAB — BASIC METABOLIC PANEL WITH GFR
BUN: 15 mg/dL (ref 7–25)
CHLORIDE: 107 mmol/L (ref 98–110)
CO2: 22 mmol/L (ref 20–31)
Calcium: 9.1 mg/dL (ref 8.6–10.4)
Creat: 1.1 mg/dL — ABNORMAL HIGH (ref 0.50–1.05)
GFR, Est African American: 67 mL/min (ref >=60)
GFR, Est Non African American: 58 mL/min — ABNORMAL LOW (ref >=60)
GLUCOSE: 87 mg/dL (ref 65–99)
POTASSIUM: 4.7 mmol/L (ref 3.5–5.3)
Sodium: 139 mmol/L (ref 135–146)

## 2016-06-30 NOTE — Telephone Encounter (Signed)
Patient came in to get labs done after 6 months that they were ordered. Labs were reordered and given to patient. Rhonda Cunningham,CMA

## 2016-07-01 ENCOUNTER — Other Ambulatory Visit: Payer: Self-pay | Admitting: Obstetrics & Gynecology

## 2016-07-01 DIAGNOSIS — N938 Other specified abnormal uterine and vaginal bleeding: Secondary | ICD-10-CM

## 2016-07-06 ENCOUNTER — Ambulatory Visit (INDEPENDENT_AMBULATORY_CARE_PROVIDER_SITE_OTHER): Payer: BLUE CROSS/BLUE SHIELD | Admitting: Osteopathic Medicine

## 2016-07-06 ENCOUNTER — Encounter: Payer: Self-pay | Admitting: Osteopathic Medicine

## 2016-07-06 ENCOUNTER — Other Ambulatory Visit (HOSPITAL_COMMUNITY)
Admission: RE | Admit: 2016-07-06 | Discharge: 2016-07-06 | Disposition: A | Discharge status: Home / Self Care | Payer: BLUE CROSS/BLUE SHIELD | Source: Ambulatory Visit | Attending: Obstetrics & Gynecology | Admitting: Obstetrics & Gynecology

## 2016-07-06 ENCOUNTER — Ambulatory Visit (INDEPENDENT_AMBULATORY_CARE_PROVIDER_SITE_OTHER): Payer: BLUE CROSS/BLUE SHIELD | Admitting: Obstetrics & Gynecology

## 2016-07-06 ENCOUNTER — Encounter: Payer: Self-pay | Admitting: Obstetrics & Gynecology

## 2016-07-06 VITALS — BP 146/88 | HR 88 | Wt 202.0 lb

## 2016-07-06 VITALS — BP 143/86 | HR 77 | Ht 67.0 in | Wt 202.0 lb

## 2016-07-06 DIAGNOSIS — Z01419 Encounter for gynecological examination (general) (routine) without abnormal findings: Secondary | ICD-10-CM | POA: Diagnosis not present

## 2016-07-06 DIAGNOSIS — N951 Menopausal and female climacteric states: Secondary | ICD-10-CM

## 2016-07-06 DIAGNOSIS — Z124 Encounter for screening for malignant neoplasm of cervix: Secondary | ICD-10-CM | POA: Diagnosis not present

## 2016-07-06 DIAGNOSIS — R030 Elevated blood-pressure reading, without diagnosis of hypertension: Secondary | ICD-10-CM | POA: Diagnosis not present

## 2016-07-06 DIAGNOSIS — R7989 Other specified abnormal findings of blood chemistry: Secondary | ICD-10-CM

## 2016-07-06 DIAGNOSIS — Z1151 Encounter for screening for human papillomavirus (HPV): Secondary | ICD-10-CM

## 2016-07-06 DIAGNOSIS — N938 Other specified abnormal uterine and vaginal bleeding: Secondary | ICD-10-CM

## 2016-07-06 MED ORDER — NORGESTREL-ETHINYL ESTRADIOL 0.3-30 MG-MCG PO TABS: 1.0000 | ORAL_TABLET | Freq: Every day | ORAL | 12 refills | Status: DC

## 2016-07-06 NOTE — Progress Notes (Signed)
Subjective:    Lorraine Patton is a 51 y.o. MW P2 (20 and 61 sons) female who presents for an annual exam. The patient has no complaints today. She is happy with OCPs, regular periods. The patient is sexually active. GYN screening history: last pap: was normal. The patient wears seatbelts: yes. The patient participates in regular exercise: yes. (tennis) Has the patient ever been transfused or tattooed?: yes. The patient reports that there is not domestic violence in her life.   Menstrual History: OB History    Gravida Para Term Preterm AB Living   SAB TAB Ectopic Multiple Live Births           2      Menarche age: 68 Patient's last menstrual period was 07/05/2016.    The following portions of the patient's history were reviewed and updated as appropriate: allergies, current medications, past family history, past medical history, past social history, past surgical history and problem list.  Review of Systems Pertinent items are noted in HPI.   FH- no breast, gyn/colon cancer Colonoscopy at 51 yo, good for 10 years Married for 23 years, denies dyspareunia Homemaker Mammo 10/17 and normal   Objective:    BP (!) 143/86   Pulse 77   Ht  (1.702 m)   Wt 202 lb (91.6 kg)   LMP 07/05/2016   BMI 31.64 kg/m   General Appearance:    Alert, cooperative, no distress, appears stated age  Head:    Normocephalic, without obvious abnormality, atraumatic  Eyes:    PERRL, conjunctiva/corneas clear, EOM's intact, fundi    benign, both eyes  Ears:    Normal TM's and external ear canals, both ears  Nose:   Nares normal, septum midline, mucosa normal, no drainage    or sinus tenderness  Throat:   Lips, mucosa, and tongue normal; teeth and gums normal  Neck:   Supple, symmetrical, trachea midline, no adenopathy;    thyroid:  no enlargement/tenderness/nodules; no carotid   bruit or JVD  Back:     Symmetric, no curvature, ROM normal, no CVA tenderness  Lungs:     Clear to  auscultation bilaterally, respirations unlabored  Chest Wall:    No tenderness or deformity   Heart:    Regular rate and rhythm, S1 and S2 normal, no murmur, rub   or gallop  Breast Exam:    No tenderness, masses, or nipple abnormality  Abdomen:     Soft, non-tender, bowel sounds active all four quadrants,    no masses, no organomegaly  Genitalia:    Normal female without lesion, discharge or tenderness     Extremities:   Extremities normal, atraumatic, no cyanosis or edema  Pulses:   2+ and symmetric all extremities  Skin:   Skin color, texture, turgor normal, no rashes or lesions  Lymph nodes:   Cervical, supraclavicular, and axillary nodes normal  Neurologic:   CNII-XII intact, normal strength, sensation and reflexes    throughout  .    Assessment:    Healthy female exam.    Plan:     Thin prep Pap smear.  with cotesting She may have refills of her OCPs for 3 years without an exam. At age 69 check FSH after 1 week of no OCPs

## 2016-07-06 NOTE — Progress Notes (Signed)
HPI: Lorraine Patton is a 51 y.o. female  who presents to [facility] today, 07/06/16,  for chief complaint of:  Chief Complaint  Patient presents with  . Follow-up    LABS AND BLOOD PRESSURE HER CUFF READ 148/97 PULSE 96    White coat HTN: 148/97 on her cuff, 146/88 on ours. Was 141/80 last visit 11/2015. Home readings Occasionally systolic in the 140s but waits 5 minutes and rechecks and then typically will drop down into the 110s to 120s. No chest pain, pressure, shortness of breath.  Perimenopause: Has some questions about possible testosterone replacement, is following with OB/GYN. Basically is just curious about this therapy have any additional information.  Elevated creatinine: Stable several years, patient states she was previously evaluated by a urologist, underwent renal ultrasound for 5 years ago which was normal. No records available at this time.      Past medical history, surgical history, social history and family history reviewed.  Patient Active Problem List   Diagnosis Date Noted  . Annual physical exam 12/02/2015  . White coat syndrome without diagnosis of hypertension 05/25/2015  . Cardiac risk counseling 05/25/2015  . HLD (hyperlipidemia) 02/15/2014    Current medication list and allergy/intolerance information reviewed.   No current outpatient prescriptions on file prior to visit.   No current facility-administered medications on file prior to visit.    No Known Allergies    Review of Systems:  Constitutional: No recent illness  HEENT: No  headache, no vision change  Cardiac: No  chest pain, No  pressure, No palpitations  Respiratory:  No  shortness of breath. No  Cough  Psychiatric: No  concerns with depression, +concerns with anxiety  Exam:  BP (!) 146/88   Pulse 88   Wt 202 lb (91.6 kg)   LMP 07/05/2016   BMI 31.64 kg/m   Constitutional: VS see above. General Appearance: alert, well-developed,  well-nourished, NAD  Respiratory: Normal respiratory effort. no wheeze, no rhonchi, no rales  Cardiovascular: S1/S2 normal, no murmur, no rub/gallop auscultated. RRR.   Neurological: Normal balance/coordination. No tremor.  Skin: warm, dry, intact.    Recent Results (from the past 2160 hour(s))  Urinalysis     Status: Abnormal   Collection Time: 06/30/16 10:14 AM  Result Value Ref Range   Color, Urine YELLOW YELLOW   APPearance CLEAR CLEAR   Specific Gravity, Urine 1.009 1.001 - 1.035   pH 5.5 5.0 - 8.0   Glucose, UA NEGATIVE NEGATIVE   Bilirubin Urine NEGATIVE NEGATIVE   Ketones, ur NEGATIVE NEGATIVE   Hgb urine dipstick TRACE (A) NEGATIVE   Protein, ur NEGATIVE NEGATIVE   Nitrite NEGATIVE NEGATIVE   Leukocytes, UA NEGATIVE NEGATIVE  BASIC METABOLIC PANEL WITH GFR     Status: Abnormal   Collection Time: 06/30/16 10:14 AM  Result Value Ref Range   Sodium 139 135 - 146 mmol/L   Potassium 4.7 3.5 - 5.3 mmol/L   Chloride 107 98 - 110 mmol/L   CO2 22 20 - 31 mmol/L   Glucose, Bld 87 65 - 99 mg/dL   BUN 15 7 - 25 mg/dL   Creat 1.61 (H) 0.96 - 1.05 mg/dL    Comment:   For patients > or = 53 years of age: The upper reference limit for Creatinine is approximately 13% higher for people identified as African-American.      Calcium 9.1 8.6 - 10.4 mg/dL   GFR, Est African American 67 >=60 mL/min  GFR, Est Non African American 58 (L) >=60 mL/min    Note: Was on period at time of urine collection  ASSESSMENT/PLAN:   White coat syndrome without diagnosis of hypertension - Home readings within normal limits on verified cuff. Continue to monitor  Elevated serum creatinine - Borderline GFR. Condition is not progressing. No elevated BP, diabetes, concern for autoimmune problem. We'll monitor with labs every 3-6 months or so. - Plan: BASIC METABOLIC PANEL WITH GFR, Urinalysis, Routine w reflex microscopic, Lipid panel, VITAMIN D 25 Hydroxy (Vit-D Deficiency,  Fractures)  Perimenopause - Advised I do not have much info regarding testosterone replacement vs traditional HRT for perimenopause, continue follow-up with GYN    Follow-up plan: Return for labs in 3 months, annual physical when due .  Visit summary with medication list and pertinent instructions was printed for patient to review, alert [place] if any changes needed. All questions at time of visit were answered - patient instructed to contact office with any additional concerns. ER/RTC precautions were reviewed with the patient and understanding verbalized.   Note: Total time spent 15 minutes, greater than 50% of the visit was spent face-to-face counseling and coordinating care for the following: The primary encounter diagnosis was White coat syndrome without diagnosis of hypertension. Diagnoses of Elevated serum creatinine and Perimenopause were also pertinent to this visit.Marland Kitchen

## 2016-07-06 NOTE — Progress Notes (Signed)
First BP reading on right arm 148/91. Repeat BP reading on left arm 143/86

## 2016-07-07 LAB — CYTOLOGY - PAP: HPV (WINDOPATH): NOT DETECTED

## 2016-07-13 ENCOUNTER — Telehealth: Payer: Self-pay

## 2016-07-13 NOTE — Telephone Encounter (Signed)
Spoke with pt to let her know that Dr.Dove wanted me to tell her that her pap wasn't resulted due to lack of cells so she should come back in a year for another pap because her pap in 2017 was negative. Pt expressed understanding.

## 2016-08-12 ENCOUNTER — Encounter: Payer: Self-pay | Admitting: Osteopathic Medicine

## 2016-08-12 ENCOUNTER — Ambulatory Visit (INDEPENDENT_AMBULATORY_CARE_PROVIDER_SITE_OTHER): Payer: BLUE CROSS/BLUE SHIELD | Admitting: Osteopathic Medicine

## 2016-08-12 VITALS — BP 135/95 | HR 81 | Temp 98.1°F | Ht 67.0 in

## 2016-08-12 DIAGNOSIS — N3090 Cystitis, unspecified without hematuria: Secondary | ICD-10-CM | POA: Diagnosis not present

## 2016-08-12 DIAGNOSIS — R300 Dysuria: Secondary | ICD-10-CM | POA: Diagnosis not present

## 2016-08-12 LAB — POCT URINALYSIS DIPSTICK
BILIRUBIN UA: NEGATIVE
Glucose, UA: NEGATIVE
Ketones, UA: NEGATIVE
NITRITE UA: NEGATIVE
PH UA: 5.5 (ref 5.0–8.0)
Protein, UA: NEGATIVE
Spec Grav, UA: 1.025 (ref 1.010–1.025)
Urobilinogen, UA: 0.2 E.U./dL

## 2016-08-12 MED ORDER — SULFAMETHOXAZOLE-TRIMETHOPRIM 800-160 MG PO TABS: 1.0000 | ORAL_TABLET | Freq: Two times a day (BID) | ORAL | 0 refills | Status: DC

## 2016-08-12 NOTE — Progress Notes (Signed)
Chief Complaint: Possible UTI  History of Present Illness: Lorraine PostSusan Patton is a 51 y.o. female who [facility] [place]  today with concerns for No chief complaint on file.   Onset: 36 hrs or so  Quality: Urgency/Frequency Associated Symptoms: see ROS below Context:  Previous UTI: rare  Recurrent UTI (3 times/more annually): no  Abx in past 3 months: no  Complicated (any of the following): no ?Diabetes ?Pregnancy ?Symptoms for seven or more days before seeking care ?Hospital acquired infection ?Renal failure ?Urinary tract obstruction ?Indwelling urethral catheter, stent, nephrostomy tube or urinary diversion ?Functional/anatomic abnormality of the urinary tract ?Renal transplantation ?Immunosuppression   Past medical, social and family history reviewed: Past Medical History:  Diagnosis Date  . Abnormal Pap smear of cervix 1996  . Essential (primary) hypertension 08/23/2014   white coat syndrome - needs to bring home cuff for verification at all visits  . HLD (hyperlipidemia) 02/15/2014   Past Surgical History:  Procedure Laterality Date  . BREAST ENHANCEMENT SURGERY  2016 and 1997  . CRYOTHERAPY  1996   Social History  Substance Use Topics  . Smoking status: Never Smoker  . Smokeless tobacco: Never Used  . Alcohol use 0.0 oz/week   The patient has a family history of  Current Outpatient Prescriptions  Medication Sig Dispense Refill  . norgestrel-ethinyl estradiol (CRYSELLE-28) 0.3-30 MG-MCG tablet Take 1 tablet by mouth daily. 28 tablet 12   No current facility-administered medications for this visit.    No Known Allergies   Review of Systems: CONSTITUTIONAL: Negative fever/chills GASTROINTESTINAL: No nausea/vomiting/abdominal pain MUSCULOSKELETAL: see below re: flank pain GENITOURINARY:    Frequency: yes  Hematuria: no  Odor: no  Incontinence: no  Flank Pain: no  Vaginal bleeding/discharge: no   Exam:  BP (!)  135/95   Pulse 81   Temp 98.1 F (36.7 C) (Oral)   Ht 5\' 7"  (1.702 m)  Constitutional: VSS, see above. General Appearance: alert, well-developed, well-nourished, NAD Respiratory: Normal respiratory effort.   Results for orders placed or performed in visit on 08/12/16 (from the past 24 hour(s))  POCT Urinalysis Dipstick     Status: Abnormal   Collection Time: 08/12/16 11:27 AM  Result Value Ref Range   Color, UA YELLOW    Clarity, UA CLEAR    Glucose, UA NEGATIVE    Bilirubin, UA NEGATIVE    Ketones, UA NEGATIVE    Spec Grav, UA 1.025 1.010 - 1.025   Blood, UA SMALL    pH, UA 5.5 5.0 - 8.0   Protein, UA NEGATIVE    Urobilinogen, UA 0.2 0.2 or 1.0 E.U./dL   Nitrite, UA NEGATIVE    Leukocytes, UA Trace (A) Negative    Previous Culture Results: none   ASSESSMENT/PLAN:   Cystitis - Plan: sulfamethoxazole-trimethoprim (BACTRIM DS) 800-160 MG tablet  Dysuria - Plan: POCT Urinalysis Dipstick, Urine Culture, Urine Microscopic   Patient advised we will call with urine culture results once available, depending on results may need to change therapy. Return if symptoms worsen or fail to improve.

## 2016-08-13 LAB — URINALYSIS, MICROSCOPIC ONLY
Casts: NONE SEEN [LPF]
Crystals: NONE SEEN [HPF]
Yeast: NONE SEEN [HPF]

## 2016-08-15 ENCOUNTER — Encounter: Payer: Self-pay | Admitting: Osteopathic Medicine

## 2016-08-15 LAB — URINE CULTURE

## 2016-08-16 ENCOUNTER — Telehealth: Payer: Self-pay

## 2016-08-16 MED ORDER — CIPROFLOXACIN HCL 500 MG PO TABS: 500.0000 mg | ORAL_TABLET | Freq: Two times a day (BID) | ORAL | 0 refills | Status: DC

## 2016-08-16 NOTE — Telephone Encounter (Signed)
Patient called stated that she was treated for UTI and she has taken Bactrim since 08/12/2016 and she said she is about 30-40% ok  Still having symptoms she wanted to know if she should be on another round of antibiotic or not. Please advise. Rhonda Cunningham,CMA

## 2016-08-17 NOTE — Telephone Encounter (Signed)
I sent her a mychart message about this - I called in alternative antibiotic and she needs to come back to clinic after this is done for nurse visit to collect urine culture to confirm resolution of infection

## 2016-08-17 NOTE — Telephone Encounter (Signed)
Patient has been informed. Lorraine Patton,CMA  

## 2016-08-24 ENCOUNTER — Ambulatory Visit (INDEPENDENT_AMBULATORY_CARE_PROVIDER_SITE_OTHER): Payer: BLUE CROSS/BLUE SHIELD | Admitting: Osteopathic Medicine

## 2016-08-24 VITALS — BP 137/81 | HR 90 | Temp 98.1°F

## 2016-08-24 DIAGNOSIS — R350 Frequency of micturition: Secondary | ICD-10-CM | POA: Diagnosis not present

## 2016-08-24 LAB — POCT URINALYSIS DIPSTICK
BILIRUBIN UA: NEGATIVE
GLUCOSE UA: NEGATIVE
KETONES UA: NEGATIVE
Leukocytes, UA: NEGATIVE
Nitrite, UA: NEGATIVE
PH UA: 7 (ref 5.0–8.0)
Protein, UA: NEGATIVE
SPEC GRAV UA: 1.01 (ref 1.010–1.025)
Urobilinogen, UA: 0.2 E.U./dL

## 2016-08-24 NOTE — Progress Notes (Signed)
Pt came into clinic today to provide new urine sample. Pt was treated for a UTI and PCP wants to ensure it is gone. Pt completed both antibiotics and states her symptoms are resolved. Pt was able to provide urine sample in clinic today. No further questions or concerns. Advised we would contact her with results.

## 2016-08-25 LAB — URINE CULTURE: Organism ID, Bacteria: NO GROWTH

## 2016-11-28 ENCOUNTER — Other Ambulatory Visit: Payer: Self-pay | Admitting: Osteopathic Medicine

## 2016-11-28 DIAGNOSIS — Z1231 Encounter for screening mammogram for malignant neoplasm of breast: Secondary | ICD-10-CM

## 2016-12-14 ENCOUNTER — Encounter: Payer: Self-pay | Admitting: Osteopathic Medicine

## 2016-12-14 ENCOUNTER — Ambulatory Visit (INDEPENDENT_AMBULATORY_CARE_PROVIDER_SITE_OTHER): Payer: BLUE CROSS/BLUE SHIELD | Admitting: Osteopathic Medicine

## 2016-12-14 VITALS — BP 135/80 | HR 96 | Temp 97.8°F | Ht 67.0 in | Wt 192.7 lb

## 2016-12-14 DIAGNOSIS — Z23 Encounter for immunization: Secondary | ICD-10-CM | POA: Diagnosis not present

## 2016-12-14 DIAGNOSIS — H6123 Impacted cerumen, bilateral: Secondary | ICD-10-CM

## 2016-12-14 NOTE — Progress Notes (Signed)
HPI: Lorraine Patton is a 51 y.o. female  who presents to [facility] today, 12/14/16,  for chief complaint of:  Chief Complaint  Patient presents with  . fluid in ears    Bilateral ears have been hurting/pressure with decreased hearing since recently got back from trip where she was scuba diving. She also wears earplugs. Has tried softening measures at home with mineral oil but nothing seems to be coming out. At this point, pressure is quite uncomfortable and diminished hearing is a problem    Past medical history, surgical history, social history and family history reviewed.  Patient Active Problem List   Diagnosis Date Noted  . Annual physical exam 12/02/2015  . White coat syndrome without diagnosis of hypertension 05/25/2015  . Cardiac risk counseling 05/25/2015  . HLD (hyperlipidemia) 02/15/2014    Current medication list and allergy/intolerance information reviewed.   Current Outpatient Prescriptions on File Prior to Visit  Medication Sig Dispense Refill  . norgestrel-ethinyl estradiol (CRYSELLE-28) 0.3-30 MG-MCG tablet Take 1 tablet by mouth daily. 28 tablet 12   No current facility-administered medications on file prior to visit.    No Known Allergies    Review of Systems:  HEENT: No  headache, no vision change  Respiratory:  No  shortness of breath.  Exam:  BP 135/80   Pulse 96   Temp 97.8 F (36.6 C)   Ht  (1.702 m)   Wt 192 lb 11.2 oz (87.4 kg)   SpO2 100%   BMI 30.18 kg/m   Constitutional: VS see above. General Appearance: alert, well-developed, well-nourished, NAD  Eyes: Normal lids and conjunctive, non-icteric sclera  Ears, Nose, Mouth, Throat: MMM, Normal external inspection ears/nares/mouth/lips/gums. Tympanic memories initially obscured by cerumen, see below for removal note. One cerumen was removed, tympanic membranes bilaterally clear, no erythema/bulging  Neck: No masses, trachea midline.   Respiratory:  Normal respiratory effort.   Psychiatric: Normal judgment/insight. Normal mood and affect. Oriented x3.      Indication: Cerumen impaction of the ear(s) Medical necessity statement: On physical examination, cerumen impairs clinically significant portions of the external auditory canal, and tympanic membrane. Noted obstructive, copious cerumen that cannot be removed without magnification and instrumentations requiring physician skills Consent: Discussed benefits and risks of procedure and verbal consent obtained Procedure: Patient was prepped for the procedure. Utilized an otoscope to assess and take note of the ear canal, the tympanic membrane, and the presence, amount, and placement of the cerumen. After softening with Colace, Gentle water irrigation and soft plastic curette was utilized to remove cerumen.  Post procedure examination: shows cerumen was completely removed. Patient tolerated procedure well. The patient is made aware that they may experience temporary vertigo, temporary hearing loss, and temporary discomfort. If these symptom last for more than 24 hours to call the clinic or proceed to the ED.    ASSESSMENT/PLAN:   Bilateral impacted cerumen  Need for influenza vaccination - Plan: Flu Vaccine QUAD 6+ mos PF IM (Fluarix Quad PF)     Follow-up plan: Return for recheck as needed.  Visit summary with medication list and pertinent instructions was printed for patient to review, alert [place] if any changes needed. All questions at time of visit were answered - patient instructed to contact office with any additional concerns. ER/RTC precautions were reviewed with the patient and understanding verbalized.

## 2017-01-11 ENCOUNTER — Other Ambulatory Visit: Payer: Self-pay | Admitting: Osteopathic Medicine

## 2017-01-11 ENCOUNTER — Ambulatory Visit (INDEPENDENT_AMBULATORY_CARE_PROVIDER_SITE_OTHER): Payer: BLUE CROSS/BLUE SHIELD

## 2017-01-11 DIAGNOSIS — Z1231 Encounter for screening mammogram for malignant neoplasm of breast: Secondary | ICD-10-CM

## 2017-01-12 LAB — URINALYSIS, ROUTINE W REFLEX MICROSCOPIC
Bilirubin Urine: NEGATIVE
Glucose, UA: NEGATIVE
HGB URINE DIPSTICK: NEGATIVE
Ketones, ur: NEGATIVE
Leukocytes, UA: NEGATIVE
NITRITE: NEGATIVE
PROTEIN: NEGATIVE
Specific Gravity, Urine: 1.003 (ref 1.001–1.03)
pH: 7 (ref 5.0–8.0)

## 2017-01-12 LAB — LIPID PANEL
CHOLESTEROL: 196 mg/dL (ref <200)
HDL: 49 mg/dL — AB (ref >50)
LDL Cholesterol (Calc): 112 mg/dL (calc) — ABNORMAL HIGH
Non-HDL Cholesterol (Calc): 147 mg/dL (calc) — ABNORMAL HIGH (ref <130)
TRIGLYCERIDES: 231 mg/dL — AB (ref <150)
Total CHOL/HDL Ratio: 4 (calc) (ref <5.0)

## 2017-01-12 LAB — BASIC METABOLIC PANEL WITH GFR
BUN: 13 mg/dL (ref 7–25)
CHLORIDE: 103 mmol/L (ref 98–110)
CO2: 26 mmol/L (ref 20–32)
Calcium: 9.4 mg/dL (ref 8.6–10.4)
Creat: 0.98 mg/dL (ref 0.50–1.05)
GFR, EST NON AFRICAN AMERICAN: 66 mL/min/{1.73_m2} (ref >=60)
GFR, Est African American: 76 mL/min/{1.73_m2} (ref >=60)
Glucose, Bld: 89 mg/dL (ref 65–99)
POTASSIUM: 3.9 mmol/L (ref 3.5–5.3)
Sodium: 138 mmol/L (ref 135–146)

## 2017-01-12 LAB — VITAMIN D 25 HYDROXY (VIT D DEFICIENCY, FRACTURES): Vit D, 25-Hydroxy: 50 ng/mL (ref 30–100)

## 2017-05-25 ENCOUNTER — Other Ambulatory Visit: Payer: Self-pay | Admitting: Obstetrics & Gynecology

## 2017-05-25 DIAGNOSIS — N938 Other specified abnormal uterine and vaginal bleeding: Secondary | ICD-10-CM

## 2017-06-19 ENCOUNTER — Telehealth: Payer: Self-pay | Admitting: Obstetrics & Gynecology

## 2017-06-19 DIAGNOSIS — N938 Other specified abnormal uterine and vaginal bleeding: Secondary | ICD-10-CM

## 2017-06-19 NOTE — Telephone Encounter (Signed)
Pt called says no refills on March script. Pt states usually gets three month supply. Pt says it is not a new medication. Please advise

## 2017-06-20 MED ORDER — NORGESTREL-ETHINYL ESTRADIOL 0.3-30 MG-MCG PO TABS
1.0000 | ORAL_TABLET | Freq: Every day | ORAL | 0 refills | Status: DC
Start: 2017-06-20 — End: 2017-07-11

## 2017-06-20 NOTE — Telephone Encounter (Signed)
Pt called for a RF on her OCP.  One RF given as pt is due for her annual in April.  She needs to make appt prior to any further RF's

## 2017-07-11 ENCOUNTER — Other Ambulatory Visit: Payer: Self-pay | Admitting: Obstetrics & Gynecology

## 2017-07-11 DIAGNOSIS — N938 Other specified abnormal uterine and vaginal bleeding: Secondary | ICD-10-CM

## 2017-08-20 ENCOUNTER — Other Ambulatory Visit: Payer: Self-pay | Admitting: Obstetrics & Gynecology

## 2017-08-20 DIAGNOSIS — N938 Other specified abnormal uterine and vaginal bleeding: Secondary | ICD-10-CM

## 2017-09-13 ENCOUNTER — Ambulatory Visit (INDEPENDENT_AMBULATORY_CARE_PROVIDER_SITE_OTHER): Payer: BLUE CROSS/BLUE SHIELD | Admitting: Osteopathic Medicine

## 2017-09-13 ENCOUNTER — Encounter: Payer: Self-pay | Admitting: Osteopathic Medicine

## 2017-09-13 VITALS — BP 149/85 | HR 94 | Temp 98.5°F | Wt 205.4 lb

## 2017-09-13 DIAGNOSIS — R030 Elevated blood-pressure reading, without diagnosis of hypertension: Secondary | ICD-10-CM | POA: Diagnosis not present

## 2017-09-13 DIAGNOSIS — R1900 Intra-abdominal and pelvic swelling, mass and lump, unspecified site: Secondary | ICD-10-CM

## 2017-09-13 NOTE — Patient Instructions (Addendum)
Plan: Abdominal problem   There is no obvious hernia on exam today. There may be an abdominal wall issue or deeper ovarian or intestinal problem.   Will send for CT scan of the abdomen +/- surgical referral or follow-up pelvic ultrasound if ovaries appear abnormal   Plan: Blood Pressure   Home numbers consistently >130 top number or >80 bottom number should make us consider blood pressure medicines

## 2017-09-13 NOTE — Progress Notes (Signed)
HPI: Lorraine PostSusan Westcott is a 52 y.o. female who  has a past medical history of Abnormal Pap smear of cervix (1996), Essential (primary) hypertension (08/23/2014), and HLD (hyperlipidemia) (02/15/2014).  she presents to [facility] [place] today, 09/13/17,  for chief complaint of:  Hernia concern  Lower R side of abdomen, feels like "something is going on" for about 3 years. Feels like a bulging/mass sensation. Getting more intense/frequent past few weeks. Worse w/ sitting up, twisting.   White coat syndrome: no CP/SOB, home BP chekcing stresses her out, takes 3 checks to get it down to 120s systolic but it will get there.     Past medical history, surgical history, and family history reviewed.  Current medication list and allergy/intolerance information reviewed.   (See remainder of HPI, ROS, Phys Exam below)  BP (!) 149/85 (BP Location: Right Arm, Patient Position: Sitting, Cuff Size: Large)   Pulse 94   Temp 98.5 F (36.9 C) (Oral)   Wt 205 lb 6.4 oz (93.2 kg)   BMI 32.17 kg/m    ASSESSMENT/PLAN:   Abdominal wall bulge - Plan: CT Abdomen Pelvis W Contrast  White coat syndrome without diagnosis of hypertension - discussed home BP goals, consider meds if persistently high     Patient Instructions  Plan: Abdominal problem   There is no obvious hernia on exam today. There may be an abdominal wall issue or deeper ovarian or intestinal problem.   Will send for CT scan of the abdomen +/- surgical referral or follow-up pelvic ultrasound if ovaries appear abnormal   Plan: Blood Pressure   Home numbers consistently >130 top number or >80 bottom number should make us consider blood pressure medicines      Follow-up plan: Return if symptoms worsen or fail to  improve.     ############################################ ############################################ ############################################ ############################################    Outpatient Encounter Medications as of 09/13/2017  Medication Sig  . CRYSELLE-28 0.3-30 MG-MCG tablet TAKE 1 TABLET BY MOUTH EVERY DAY   No facility-administered encounter medications on file as of 09/13/2017.    No Known Allergies    Review of Systems:  Constitutional: No recent illness  HEENT: No  headache, no vision change  Cardiac: No  chest pain, No  pressure, No palpitations  Respiratory:  No  shortness of breath. No  Cough  Gastrointestinal: +abdominal pain, no change in bowel habits, no N/V, no blood in stool   Musculoskeletal: No new myalgia/arthralgia  Skin: No  Rash  Neurologic: No  weakness, No  Dizziness   Exam:  BP (!) 149/85 (BP Location: Right Arm, Patient Position: Sitting, Cuff Size: Large)   Pulse 94   Temp 98.5 F (36.9 C) (Oral)   Wt 205 lb 6.4 oz (93.2 kg)   BMI 32.17 kg/m   Constitutional: VS see above. General Appearance: alert, well-developed, well-nourished, NAD  Eyes: Normal lids and conjunctive, non-icteric sclera  Ears, Nose, Mouth, Throat: MMM, Normal external inspection ears/nares/mouth/lips/gums.  Neck: No masses, trachea midline.   Respiratory: Normal respiratory effort. no wheeze, no rhonchi, no rales  Cardiovascular: S1/S2 normal, no murmur, no rub/gallop auscultated. RRR.   Musculoskeletal: Gait normal. Symmetric and independent movement of all extremities  Abdominal: nontender, nondistended, BS WNL x4, no mass or organomegaly appreciated   Neurological: Normal balance/coordination. No tremor.  Skin: warm, dry, intact.   Psychiatric: Normal judgment/insight. Normal mood and affect. Oriented x3.   Visit summary with medication list and pertinent instructions was printed for patient to review, advised to  alert [place] if any  changes needed. All questions at time of visit were answered - patient instructed to contact office with any additional concerns. ER/RTC precautions were reviewed with the patient and understanding verbalized.   Follow-up plan: Return if symptoms worsen or fail to improve.   Please note: voice recognition software was used to produce this document, and typos may escape review. Please contact Dr. Lyn Hollingshead for any needed clarifications.

## 2017-09-14 ENCOUNTER — Encounter: Payer: Self-pay | Admitting: Osteopathic Medicine

## 2017-09-18 ENCOUNTER — Ambulatory Visit (INDEPENDENT_AMBULATORY_CARE_PROVIDER_SITE_OTHER): Payer: BLUE CROSS/BLUE SHIELD

## 2017-09-18 DIAGNOSIS — K429 Umbilical hernia without obstruction or gangrene: Secondary | ICD-10-CM | POA: Diagnosis not present

## 2017-09-18 MED ORDER — IOPAMIDOL (ISOVUE-300) INJECTION 61%
100.0000 mL | Freq: Once | INTRAVENOUS | Status: AC | PRN
  Administered 2017-09-18: 100 mL via INTRAVENOUS

## 2017-09-18 MED ORDER — BARIUM SULFATE 2 % PO SUSP: 450.0000 mL | Freq: Once | ORAL | Status: DC

## 2017-09-19 ENCOUNTER — Encounter: Payer: Self-pay | Admitting: Osteopathic Medicine

## 2017-09-20 ENCOUNTER — Telehealth: Payer: Self-pay | Admitting: Osteopathic Medicine

## 2017-09-20 ENCOUNTER — Encounter: Payer: Self-pay | Admitting: Osteopathic Medicine

## 2017-09-20 DIAGNOSIS — K429 Umbilical hernia without obstruction or gangrene: Secondary | ICD-10-CM

## 2017-09-20 NOTE — Telephone Encounter (Signed)
Gen Surg referral to consider repair hernia

## 2017-09-21 ENCOUNTER — Other Ambulatory Visit: Payer: Self-pay | Admitting: Obstetrics & Gynecology

## 2017-09-21 DIAGNOSIS — N938 Other specified abnormal uterine and vaginal bleeding: Secondary | ICD-10-CM

## 2017-09-25 ENCOUNTER — Other Ambulatory Visit: Payer: Self-pay | Admitting: *Deleted

## 2017-09-25 DIAGNOSIS — N938 Other specified abnormal uterine and vaginal bleeding: Secondary | ICD-10-CM

## 2017-09-25 MED ORDER — NORGESTREL-ETHINYL ESTRADIOL 0.3-30 MG-MCG PO TABS: 1.0000 | ORAL_TABLET | Freq: Every day | ORAL | 3 refills | Status: DC

## 2017-11-01 ENCOUNTER — Encounter: Payer: Self-pay | Admitting: Osteopathic Medicine

## 2017-11-01 DIAGNOSIS — K429 Umbilical hernia without obstruction or gangrene: Secondary | ICD-10-CM | POA: Diagnosis not present

## 2017-11-01 DIAGNOSIS — I10 Essential (primary) hypertension: Secondary | ICD-10-CM

## 2017-11-02 ENCOUNTER — Encounter: Payer: Self-pay | Admitting: Osteopathic Medicine

## 2017-11-02 MED ORDER — HYDROCHLOROTHIAZIDE 12.5 MG PO TABS: 12.5000 mg | ORAL_TABLET | Freq: Every day | ORAL | 1 refills | Status: DC

## 2017-11-24 ENCOUNTER — Other Ambulatory Visit: Payer: Self-pay | Admitting: Osteopathic Medicine

## 2017-11-28 ENCOUNTER — Other Ambulatory Visit (HOSPITAL_BASED_OUTPATIENT_CLINIC_OR_DEPARTMENT_OTHER): Payer: Self-pay | Admitting: Radiology

## 2017-11-28 ENCOUNTER — Other Ambulatory Visit (HOSPITAL_COMMUNITY): Payer: Self-pay | Admitting: Obstetrics & Gynecology

## 2017-11-28 DIAGNOSIS — Z1239 Encounter for other screening for malignant neoplasm of breast: Secondary | ICD-10-CM

## 2017-12-12 ENCOUNTER — Ambulatory Visit: Payer: BLUE CROSS/BLUE SHIELD | Admitting: Osteopathic Medicine

## 2017-12-14 ENCOUNTER — Ambulatory Visit: Payer: BLUE CROSS/BLUE SHIELD | Admitting: Osteopathic Medicine

## 2017-12-20 ENCOUNTER — Other Ambulatory Visit: Payer: Self-pay | Admitting: Osteopathic Medicine

## 2017-12-20 LAB — BASIC METABOLIC PANEL
BUN/Creatinine Ratio: 16 (calc) (ref 6–22)
BUN: 18 mg/dL (ref 7–25)
CO2: 26 mmol/L (ref 20–32)
CREATININE: 1.1 mg/dL — AB (ref 0.50–1.05)
Calcium: 9.2 mg/dL (ref 8.6–10.4)
Chloride: 104 mmol/L (ref 98–110)
Glucose, Bld: 83 mg/dL (ref 65–99)
POTASSIUM: 4.4 mmol/L (ref 3.5–5.3)
Sodium: 138 mmol/L (ref 135–146)

## 2017-12-20 NOTE — Telephone Encounter (Signed)
Requested medication (s) are due for refill today: No - Pt. Should still have a refill  Requested medication (s) are on the active medication list: Yes  Last refill:  11/24/17  Future visit scheduled: Yes  Notes to clinic:  Medication should still have a refill left.    Requested Prescriptions  Pending Prescriptions Disp Refills   hydrochlorothiazide (HYDRODIURIL) 12.5 MG tablet [Pharmacy Med Name: HYDROCHLOROTHIAZIDE 12.5 MG TB] 30 tablet 1    Sig: TAKE 1 TABLET BY MOUTH EVERY DAY     There is no refill protocol information for this order

## 2017-12-21 ENCOUNTER — Ambulatory Visit: Payer: BLUE CROSS/BLUE SHIELD | Admitting: Osteopathic Medicine

## 2017-12-22 ENCOUNTER — Encounter: Payer: Self-pay | Admitting: Osteopathic Medicine

## 2017-12-22 ENCOUNTER — Ambulatory Visit (INDEPENDENT_AMBULATORY_CARE_PROVIDER_SITE_OTHER): Payer: BLUE CROSS/BLUE SHIELD | Admitting: Osteopathic Medicine

## 2017-12-22 VITALS — BP 160/100 | HR 102 | Temp 98.3°F | Wt 205.2 lb

## 2017-12-22 DIAGNOSIS — Z23 Encounter for immunization: Secondary | ICD-10-CM

## 2017-12-22 DIAGNOSIS — Z0000 Encounter for general adult medical examination without abnormal findings: Secondary | ICD-10-CM

## 2017-12-22 DIAGNOSIS — I10 Essential (primary) hypertension: Secondary | ICD-10-CM | POA: Diagnosis not present

## 2017-12-22 MED ORDER — HYDROCHLOROTHIAZIDE 12.5 MG PO TABS: 12.5000 mg | ORAL_TABLET | Freq: Every day | ORAL | 3 refills | Status: DC

## 2017-12-22 NOTE — Progress Notes (Signed)
HPI: Lorraine Patton is a 52 y.o. female who  has a past medical history of Abnormal Pap smear of cervix (1996), Essential (primary) hypertension (08/23/2014), and HLD (hyperlipidemia) (02/15/2014).  she presents to [facility] today, 12/22/17,  for chief complaint of:  BP follow-up  White coat HTN, has home BP monitor which has been readins systolic around 120's-130's and diastolic around 80's.   Creatinine slightly elevated but it has looked like this in the past off of medications.  She was previously evaluated by nephrology, 24 urine was negative and renal ultrasound was negative, she works out frequently with weight lifting.  Meds: HCTZ 12.5  OCP  BP Readings from Last 3 Encounters:  12/22/17 (!) 160/100 - home monitor 155/97  09/13/17 (!) 149/85  12/14/16 135/80          Past medical history, surgical history, and family history reviewed.  Current medication list and allergy/intolerance information reviewed.   (See remainder of HPI, ROS, Phys Exam below)  No results found.  Results for orders placed or performed in visit on 11/01/17 (from the past 72 hour(s))  Basic metabolic panel     Status: Abnormal   Collection Time: 12/20/17  9:03 AM  Result Value Ref Range   Glucose, Bld 83 65 - 99 mg/dL    Comment: .            Fasting reference interval .    BUN 18 7 - 25 mg/dL   Creat 1.61 (H) 0.96 - 1.05 mg/dL    Comment: For patients >84 years of age, the reference limit for Creatinine is approximately 13% higher for people identified as African-American. .    BUN/Creatinine Ratio 16 6 - 22 (calc)   Sodium 138 135 - 146 mmol/L   Potassium 4.4 3.5 - 5.3 mmol/L   Chloride 104 98 - 110 mmol/L   CO2 26 20 - 32 mmol/L   Calcium 9.2 8.6 - 10.4 mg/dL     ASSESSMENT/PLAN:   White coat syndrome with hypertension - Creatinine at baseline, okay to continue current medications - Plan: CBC, COMPLETE METABOLIC PANEL WITH GFR, Lipid  panel  Need for influenza vaccination - Plan: Flu Vaccine QUAD 6+ mos PF IM (Fluarix Quad PF), CBC, COMPLETE METABOLIC PANEL WITH GFR, Lipid panel  Annual physical exam - Labs ordered for future visit - Plan: CBC, COMPLETE METABOLIC PANEL WITH GFR, Lipid panel   Meds ordered this encounter  Medications  . hydrochlorothiazide (HYDRODIURIL) 12.5 MG tablet    Sig: Take 1 tablet (12.5 mg total) by mouth daily.    Dispense:  90 tablet    Refill:  3     Follow-up plan: Return in about 6 months (around 06/23/2018) for ANNUAL PHYSICAL .                ############################################ ############################################ ############################################ ############################################    Outpatient Encounter Medications as of 12/22/2017  Medication Sig  . CRYSELLE-28 0.3-30 MG-MCG tablet TAKE 1 TABLET BY MOUTH EVERY DAY  . hydrochlorothiazide (HYDRODIURIL) 12.5 MG tablet TAKE 1 TABLET BY MOUTH EVERY DAY  . norgestrel-ethinyl estradiol (CRYSELLE-28) 0.3-30 MG-MCG tablet Take 1 tablet by mouth daily.   Facility-Administered Encounter Medications as of 12/22/2017  Medication  . barium (READI-CAT 2) 2 % suspension 450 mL   No Known Allergies    Review of Systems:  Constitutional: No recent illness  HEENT: No  headache, no vision change  Cardiac: No  chest pain, No  pressure, No palpitations  Respiratory:  No  shortness of breath. No  Cough  Gastrointestinal: No  abdominal pain  Exam:  BP (!) 160/100 (BP Location: Left Arm, Patient Position: Sitting, Cuff Size: Normal)   Pulse (!) 102   Temp 98.3 F (36.8 C) (Oral)   Wt 205 lb 3.2 oz (93.1 kg)   BMI 32.14 kg/m   Constitutional: VS see above. General Appearance: alert, well-developed, well-nourished, NAD  Eyes: Normal lids and conjunctive, non-icteric sclera  Ears, Nose, Mouth, Throat: MMM, Normal external inspection ears/nares/mouth/lips/gums.  Neck: No  masses, trachea midline.   Respiratory: Normal respiratory effort. no wheeze, no rhonchi, no rales  Cardiovascular: S1/S2 normal, no murmur, no rub/gallop auscultated. RRR.   Musculoskeletal: Gait normal. Symmetric and independent movement of all extremities  Neurological: Normal balance/coordination. No tremor.  Skin: warm, dry, intact.   Psychiatric: Normal judgment/insight. Normal mood and affect. Oriented x3.   Visit summary with medication list and pertinent instructions was printed for patient to review, advised to alert [place] if any changes needed. All questions at time of visit were answered - patient instructed to contact office with any additional concerns. ER/RTC precautions were reviewed with the patient and understanding verbalized.   Follow-up plan: Return in about 6 months (around 06/23/2018) for ANNUAL PHYSICAL .    Please note: voice recognition software was used to produce this document, and typos may escape review. Please contact Dr. Lyn Hollingshead for any needed clarifications.

## 2018-01-03 DIAGNOSIS — H10413 Chronic giant papillary conjunctivitis, bilateral: Secondary | ICD-10-CM | POA: Diagnosis not present

## 2018-01-11 ENCOUNTER — Ambulatory Visit: Payer: BLUE CROSS/BLUE SHIELD

## 2018-01-11 ENCOUNTER — Ambulatory Visit (INDEPENDENT_AMBULATORY_CARE_PROVIDER_SITE_OTHER): Payer: BLUE CROSS/BLUE SHIELD

## 2018-01-11 DIAGNOSIS — Z1231 Encounter for screening mammogram for malignant neoplasm of breast: Secondary | ICD-10-CM

## 2018-01-11 DIAGNOSIS — Z1239 Encounter for other screening for malignant neoplasm of breast: Secondary | ICD-10-CM

## 2018-01-12 ENCOUNTER — Ambulatory Visit: Payer: BLUE CROSS/BLUE SHIELD

## 2018-02-02 DIAGNOSIS — H10413 Chronic giant papillary conjunctivitis, bilateral: Secondary | ICD-10-CM | POA: Diagnosis not present

## 2018-02-12 DIAGNOSIS — L814 Other melanin hyperpigmentation: Secondary | ICD-10-CM | POA: Diagnosis not present

## 2018-02-12 DIAGNOSIS — L720 Epidermal cyst: Secondary | ICD-10-CM | POA: Diagnosis not present

## 2018-02-12 DIAGNOSIS — L738 Other specified follicular disorders: Secondary | ICD-10-CM | POA: Diagnosis not present

## 2018-02-12 DIAGNOSIS — D225 Melanocytic nevi of trunk: Secondary | ICD-10-CM | POA: Diagnosis not present

## 2018-04-26 DIAGNOSIS — J0100 Acute maxillary sinusitis, unspecified: Secondary | ICD-10-CM | POA: Diagnosis not present

## 2018-04-26 DIAGNOSIS — I10 Essential (primary) hypertension: Secondary | ICD-10-CM | POA: Diagnosis not present

## 2018-06-25 ENCOUNTER — Encounter: Payer: BLUE CROSS/BLUE SHIELD | Admitting: Osteopathic Medicine

## 2018-07-23 ENCOUNTER — Encounter: Payer: BLUE CROSS/BLUE SHIELD | Admitting: Osteopathic Medicine

## 2018-07-26 LAB — CBC
HEMATOCRIT: 44.9 % (ref 35.0–45.0)
HEMOGLOBIN: 15.6 g/dL — AB (ref 11.7–15.5)
MCH: 31 pg (ref 27.0–33.0)
MCHC: 34.7 g/dL (ref 32.0–36.0)
MCV: 89.1 fL (ref 80.0–100.0)
MPV: 9.8 fL (ref 7.5–12.5)
Platelets: 300 10*3/uL (ref 140–400)
RBC: 5.04 10*6/uL (ref 3.80–5.10)
RDW: 13 % (ref 11.0–15.0)
WBC: 5.4 10*3/uL (ref 3.8–10.8)

## 2018-07-26 LAB — COMPLETE METABOLIC PANEL WITH GFR
AG Ratio: 1.7 (calc) (ref 1.0–2.5)
ALBUMIN MSPROF: 4.5 g/dL (ref 3.6–5.1)
ALKALINE PHOSPHATASE (APISO): 68 U/L (ref 37–153)
ALT: 32 U/L — ABNORMAL HIGH (ref 6–29)
AST: 25 U/L (ref 10–35)
BILIRUBIN TOTAL: 0.5 mg/dL (ref 0.2–1.2)
BUN / CREAT RATIO: 16 (calc) (ref 6–22)
BUN: 18 mg/dL (ref 7–25)
CO2: 29 mmol/L (ref 20–32)
CREATININE: 1.14 mg/dL — AB (ref 0.50–1.05)
Calcium: 9.4 mg/dL (ref 8.6–10.4)
Chloride: 102 mmol/L (ref 98–110)
GFR, EST AFRICAN AMERICAN: 63 mL/min/{1.73_m2} (ref >=60)
GFR, Est Non African American: 54 mL/min/{1.73_m2} — ABNORMAL LOW (ref >=60)
GLOBULIN: 2.6 g/dL (ref 1.9–3.7)
Glucose, Bld: 93 mg/dL (ref 65–99)
Potassium: 4.4 mmol/L (ref 3.5–5.3)
SODIUM: 138 mmol/L (ref 135–146)
TOTAL PROTEIN: 7.1 g/dL (ref 6.1–8.1)

## 2018-07-26 LAB — LIPID PANEL
CHOL/HDL RATIO: 4.7 (calc) (ref <5.0)
CHOLESTEROL: 235 mg/dL — AB (ref <200)
HDL: 50 mg/dL (ref >=50)
LDL CHOLESTEROL (CALC): 160 mg/dL — AB
Non-HDL Cholesterol (Calc): 185 mg/dL (calc) — ABNORMAL HIGH (ref <130)
Triglycerides: 128 mg/dL (ref <150)

## 2018-07-30 ENCOUNTER — Ambulatory Visit (INDEPENDENT_AMBULATORY_CARE_PROVIDER_SITE_OTHER): Payer: BLUE CROSS/BLUE SHIELD | Admitting: Osteopathic Medicine

## 2018-07-30 VITALS — BP 121/83 | HR 70

## 2018-07-30 DIAGNOSIS — E782 Mixed hyperlipidemia: Secondary | ICD-10-CM

## 2018-07-30 DIAGNOSIS — I10 Essential (primary) hypertension: Secondary | ICD-10-CM | POA: Diagnosis not present

## 2018-07-30 NOTE — Progress Notes (Signed)
Virtual Visit via Video (App used: Doximity) Note  I connected with      Lorraine Patton on 07/30/18 at 9:00 by a telemedicine application and verified that I am speaking with the correct person using two identifiers.  Patient is at home I am working from home    I discussed the limitations of evaluation and management by telemedicine and the availability of in person appointments. The patient expressed understanding and agreed to proceed.  History of Present Illness: Lorraine Patton is a 53 y.o. female who would like to discuss  Chief Complaint  Patient presents with  . Blood Pressure Check  . Medication Refill   BP looking good on home measurements  No CP/SOB, no HA/VC  Spent some time reviewing labs as below, all questions answered Reports room for improvement as far as diet/exercise is concerned    Observations/Objective: BP 121/83 (BP Location: Left Arm, Patient Position: Sitting, Cuff Size: Normal)   Pulse 70  BP Readings from Last 3 Encounters:  07/30/18 121/83  12/22/17 (!) 160/100  09/13/17 (!) 149/85   Exam: Normal Speech.  NAD  Lab and Radiology Results  Recent Results (from the past 2160 hour(s))  CBC     Status: Abnormal   Collection Time: 07/25/18  9:20 AM  Result Value Ref Range   WBC 5.4 3.8 - 10.8 Thousand/uL   RBC 5.04 3.80 - 5.10 Million/uL   Hemoglobin 15.6 (H) 11.7 - 15.5 g/dL   HCT 16.1 09.6 - 04.5 %   MCV 89.1 80.0 - 100.0 fL   MCH 31.0 27.0 - 33.0 pg   MCHC 34.7 32.0 - 36.0 g/dL   RDW 40.9 81.1 - 91.4 %   Platelets 300 140 - 400 Thousand/uL   MPV 9.8 7.5 - 12.5 fL  COMPLETE METABOLIC PANEL WITH GFR     Status: Abnormal   Collection Time: 07/25/18  9:20 AM  Result Value Ref Range   Glucose, Bld 93 65 - 99 mg/dL    Comment: .            Fasting reference interval .    BUN 18 7 - 25 mg/dL   Creat 7.82 (H) 9.56 - 1.05 mg/dL    Comment: For patients >58 years of age, the reference limit for Creatinine is approximately 13% higher for  people identified as African-American. .    GFR, Est Non African American 54 (L) > OR = 60 mL/min/1.74m2   GFR, Est African American 63 > OR = 60 mL/min/1.63m2   BUN/Creatinine Ratio 16 6 - 22 (calc)   Sodium 138 135 - 146 mmol/L   Potassium 4.4 3.5 - 5.3 mmol/L   Chloride 102 98 - 110 mmol/L   CO2 29 20 - 32 mmol/L   Calcium 9.4 8.6 - 10.4 mg/dL   Total Protein 7.1 6.1 - 8.1 g/dL   Albumin 4.5 3.6 - 5.1 g/dL   Globulin 2.6 1.9 - 3.7 g/dL (calc)   AG Ratio 1.7 1.0 - 2.5 (calc)   Total Bilirubin 0.5 0.2 - 1.2 mg/dL   Alkaline phosphatase (APISO) 68 37 - 153 U/L   AST 25 10 - 35 U/L   ALT 32 (H) 6 - 29 U/L  Lipid panel     Status: Abnormal   Collection Time: 07/25/18  9:20 AM  Result Value Ref Range   Cholesterol 235 (H) <200 mg/dL   HDL 50 > OR = 50 mg/dL   Triglycerides 213 <086 mg/dL   LDL Cholesterol (  Calc) 160 (H) mg/dL (calc)    Comment: Reference range: <100 . Desirable range <100 mg/dL for primary prevention;   <70 mg/dL for patients with CHD or diabetic patients  with > or = 2 CHD risk factors. Marland Kitchen LDL-C is now calculated using the Martin-Hopkins  calculation, which is a validated novel method providing  better accuracy than the Friedewald equation in the  estimation of LDL-C.  Horald Pollen et al. Lenox Ahr. 8182;993(71): 2061-2068  (http://education.[facility].com/faq/FAQ164)    Total CHOL/HDL Ratio 4.7 <5.0 (calc)   Non-HDL Cholesterol (Calc) 185 (H) <130 mg/dL (calc)    Comment: For patients with diabetes plus 1 major ASCVD risk  factor, treating to a non-HDL-C goal of <100 mg/dL  (LDL-C of <69 mg/dL) is considered a therapeutic  option.          Assessment and Plan: 53 y.o. female with The encounter diagnosis was Essential hypertension.   PDMP not reviewed this encounter. No orders of the defined types were placed in this encounter.  No orders of the defined types were placed in this encounter.  There are no Patient Instructions on file for this  visit.  Instructions sent via MyChart. If MyChart not available, pt was given option for info via personal e-mail w/ no guarantee of protected health info over unsecured e-mail communication, and MyChart sign-up instructions were included.   Follow Up Instructions: Return in about 3 months (around 10/30/2018).    I discussed the assessment and treatment plan with the patient. The patient was provided an opportunity to ask questions and all were answered. The patient agreed with the plan and demonstrated an understanding of the instructions.   The patient was advised to call back or seek an in-person evaluation if any new concerns, if symptoms worsen or if the condition fails to improve as anticipated.  15 minutes of non-face-to-face time was provided during this encounter.                      Historical information moved to improve visibility of documentation.  Past Medical History:  Diagnosis Date  . Abnormal Pap smear of cervix 1996  . Essential (primary) hypertension 08/23/2014   white coat syndrome - needs to bring home cuff for verification at all visits  . HLD (hyperlipidemia) 02/15/2014   Past Surgical History:  Procedure Laterality Date  . AUGMENTATION MAMMAPLASTY    . BREAST ENHANCEMENT SURGERY  2016 and 1997  . CRYOTHERAPY  1996   Social History   Tobacco Use  . Smoking status: Never Smoker  . Smokeless tobacco: Never Used  Substance Use Topics  . Alcohol use: Yes    Alcohol/week: 0.0 standard drinks   family history includes Cancer in her sister; Diabetes in her maternal grandmother; HIV/AIDS in her brother; Heart disease in her maternal grandfather; Hyperlipidemia in her maternal uncle; Myelodysplastic syndrome in her sister.  Medications: Current Outpatient Medications  Medication Sig Dispense Refill  . hydrochlorothiazide (HYDRODIURIL) 12.5 MG tablet Take 1 tablet (12.5 mg total) by mouth daily. 90 tablet 3  . norgestrel-ethinyl estradiol  (CRYSELLE-28) 0.3-30 MG-MCG tablet Take 1 tablet by mouth daily. (Patient not taking: Reported on 07/30/2018) 3 Package 3   No current facility-administered medications for this visit.    Facility-Administered Medications Ordered in Other Visits  Medication Dose Route Frequency Provider Last Rate Last Dose  . barium (READI-CAT 2) 2 % suspension 450 mL  450 mL Oral Once Sunnie Nielsen, DO       No  Known Allergies  PDMP not reviewed this encounter. No orders of the defined types were placed in this encounter.  No orders of the defined types were placed in this encounter.

## 2018-07-31 ENCOUNTER — Encounter: Payer: Self-pay | Admitting: Osteopathic Medicine

## 2018-07-31 MED ORDER — HYDROCHLOROTHIAZIDE 12.5 MG PO TABS: 12.5000 mg | ORAL_TABLET | Freq: Every day | ORAL | 3 refills | Status: DC

## 2019-01-29 ENCOUNTER — Telehealth: Payer: Self-pay

## 2019-01-29 ENCOUNTER — Other Ambulatory Visit: Payer: Self-pay | Admitting: Obstetrics & Gynecology

## 2019-01-29 DIAGNOSIS — Z0000 Encounter for general adult medical examination without abnormal findings: Secondary | ICD-10-CM

## 2019-01-29 DIAGNOSIS — E782 Mixed hyperlipidemia: Secondary | ICD-10-CM

## 2019-01-29 DIAGNOSIS — Z1231 Encounter for screening mammogram for malignant neoplasm of breast: Secondary | ICD-10-CM

## 2019-01-29 DIAGNOSIS — Z8619 Personal history of other infectious and parasitic diseases: Secondary | ICD-10-CM

## 2019-01-29 NOTE — Telephone Encounter (Signed)
Pt called requesting lab order for Lipid, chem, and a CBC. She is also inquiring about an antibody test for Covid. It appears that provider placed an order on 07/2018 for Lipid testing. Pls advise, thanks.

## 2019-01-30 ENCOUNTER — Other Ambulatory Visit: Payer: Self-pay

## 2019-01-30 ENCOUNTER — Encounter: Payer: Self-pay | Admitting: Obstetrics & Gynecology

## 2019-01-30 ENCOUNTER — Ambulatory Visit (INDEPENDENT_AMBULATORY_CARE_PROVIDER_SITE_OTHER): Payer: BC Managed Care – PPO | Admitting: Obstetrics & Gynecology

## 2019-01-30 VITALS — BP 170/97 | HR 116 | Ht 67.0 in | Wt 207.0 lb

## 2019-01-30 DIAGNOSIS — Z1151 Encounter for screening for human papillomavirus (HPV): Secondary | ICD-10-CM | POA: Diagnosis not present

## 2019-01-30 DIAGNOSIS — Z124 Encounter for screening for malignant neoplasm of cervix: Secondary | ICD-10-CM | POA: Diagnosis not present

## 2019-01-30 DIAGNOSIS — Z01419 Encounter for gynecological examination (general) (routine) without abnormal findings: Secondary | ICD-10-CM

## 2019-01-30 DIAGNOSIS — N912 Amenorrhea, unspecified: Secondary | ICD-10-CM

## 2019-01-30 NOTE — Progress Notes (Signed)
Subjective:    Lorraine Patton is a 53 y.o. married P2 (69 and 57 yo sons) who presents for an annual exam. The patient has no complaints today. She has not had a period in 12 months. The patient is sexually active. GYN screening history: last pap: was normal. The patient wears seatbelts: yes. The patient participates in regular exercise: yes. (tennis) Has the patient ever been transfused or tattooed?: yes. The patient reports that there is not domestic violence in her life.   Menstrual History: OB History    Gravida  2   Para  2   Term  2   Preterm      AB      Living  2     SAB      TAB      Ectopic      Multiple      Live Births  2           Menarche age: 89 Patient's last menstrual period was 01/16/2018 (approximate).    The following portions of the patient's history were reviewed and updated as appropriate: allergies, current medications, past family history, past medical history, past social history, past surgical history and problem list.  Review of Systems Pertinent items are noted in HPI.   FH- no breast/gyn/colon cancer Colonoscopy UTD Married for 25 years :) Flu vaccine 9/20 Mammogram next month Homemaker Her primary care doc is doing her fasting labs (Dr. Sheppard Coil)   Objective:    BP (!) 170/97   Pulse (!) 116   Ht 5\' 7"  (1.702 m)   Wt 207 lb (93.9 kg)   LMP 01/16/2018 (Approximate)   BMI 32.42 kg/m   General Appearance:    Alert, cooperative, no distress, appears stated age  Head:    Normocephalic, without obvious abnormality, atraumatic  Eyes:    PERRL, conjunctiva/corneas clear, EOM's intact, fundi    benign, both eyes  Ears:    Normal TM's and external ear canals, both ears  Nose:   Nares normal, septum midline, mucosa normal, no drainage    or sinus tenderness  Throat:   Lips, mucosa, and tongue normal; teeth and gums normal  Neck:   Supple, symmetrical, trachea midline, no adenopathy;    thyroid:  no enlargement/tenderness/nodules; no  carotid   bruit or JVD  Back:     Symmetric, no curvature, ROM normal, no CVA tenderness  Lungs:     Clear to auscultation bilaterally, respirations unlabored  Chest Wall:    No tenderness or deformity   Heart:    Regular rate and rhythm, S1 and S2 normal, no murmur, rub   or gallop  Breast Exam:    No tenderness, masses, or nipple abnormality  Abdomen:     Soft, non-tender, bowel sounds active all four quadrants,    no masses, no organomegaly  Genitalia:    Normal female without lesion, discharge or tenderness, normal size and shape, anteverted, mobile, non-tender, normal adnexal exam      Extremities:   Extremities normal, atraumatic, no cyanosis or edema  Pulses:   2+ and symmetric all extremities  Skin:   Skin color, texture, turgor normal, no rashes or lesions  Lymph nodes:   Cervical, supraclavicular, and axillary nodes normal  Neurologic:   CNII-XII intact, normal strength, sensation and reflexes    throughout  .    Assessment:    Healthy female exam.    Plan:     Thin prep Pap smear. with cotesting Check  FSH

## 2019-01-30 NOTE — Addendum Note (Signed)
Addended by: Lyndal Rainbow on: 01/30/2019 10:55 AM   Modules accepted: Orders

## 2019-01-31 LAB — CYTOLOGY - PAP
Comment: NEGATIVE
Diagnosis: NEGATIVE
High risk HPV: NEGATIVE

## 2019-01-31 LAB — LIPID PANEL
Cholesterol: 243 mg/dL — ABNORMAL HIGH (ref <200)
HDL: 47 mg/dL — ABNORMAL LOW (ref >=50)
LDL Cholesterol (Calc): 157 mg/dL (calc) — ABNORMAL HIGH
Non-HDL Cholesterol (Calc): 196 mg/dL (calc) — ABNORMAL HIGH (ref <130)
Total CHOL/HDL Ratio: 5.2 (calc) — ABNORMAL HIGH (ref <5.0)
Triglycerides: 229 mg/dL — ABNORMAL HIGH (ref <150)

## 2019-01-31 LAB — FOLLICLE STIMULATING HORMONE: FSH: 100.7 m[IU]/mL

## 2019-01-31 NOTE — Telephone Encounter (Signed)
Left a detailed vm msg for pt regarding lab order. Aware of lab schedule/hours. Direct call back info provided.

## 2019-01-31 NOTE — Telephone Encounter (Signed)
Orders are in

## 2019-02-01 ENCOUNTER — Ambulatory Visit: Payer: BLUE CROSS/BLUE SHIELD | Admitting: Osteopathic Medicine

## 2019-02-06 LAB — CBC
HCT: 42.1 % (ref 35.0–45.0)
Hemoglobin: 14.5 g/dL (ref 11.7–15.5)
MCH: 30.7 pg (ref 27.0–33.0)
MCHC: 34.4 g/dL (ref 32.0–36.0)
MCV: 89 fL (ref 80.0–100.0)
MPV: 9.7 fL (ref 7.5–12.5)
Platelets: 319 10*3/uL (ref 140–400)
RBC: 4.73 10*6/uL (ref 3.80–5.10)
RDW: 12.5 % (ref 11.0–15.0)
WBC: 4.3 10*3/uL (ref 3.8–10.8)

## 2019-02-06 LAB — COMPLETE METABOLIC PANEL WITH GFR
AG Ratio: 1.6 (calc) (ref 1.0–2.5)
ALT: 32 U/L — ABNORMAL HIGH (ref 6–29)
AST: 21 U/L (ref 10–35)
Albumin: 4.2 g/dL (ref 3.6–5.1)
Alkaline phosphatase (APISO): 67 U/L (ref 37–153)
BUN: 23 mg/dL (ref 7–25)
CO2: 29 mmol/L (ref 20–32)
Calcium: 9.3 mg/dL (ref 8.6–10.4)
Chloride: 106 mmol/L (ref 98–110)
Creat: 1.05 mg/dL (ref 0.50–1.05)
GFR, Est African American: 69 mL/min/{1.73_m2} (ref >=60)
GFR, Est Non African American: 60 mL/min/{1.73_m2} (ref >=60)
Globulin: 2.6 g/dL (calc) (ref 1.9–3.7)
Glucose, Bld: 94 mg/dL (ref 65–99)
Potassium: 4.3 mmol/L (ref 3.5–5.3)
Sodium: 140 mmol/L (ref 135–146)
Total Bilirubin: 0.5 mg/dL (ref 0.2–1.2)
Total Protein: 6.8 g/dL (ref 6.1–8.1)

## 2019-02-06 LAB — SAR COV2 SEROLOGY (COVID19)AB(IGG),IA: SARS CoV2 AB IGG: NEGATIVE

## 2019-02-06 LAB — LIPID PANEL
Cholesterol: 229 mg/dL — ABNORMAL HIGH (ref <200)
HDL: 44 mg/dL — ABNORMAL LOW (ref >=50)
LDL Cholesterol (Calc): 152 mg/dL (calc) — ABNORMAL HIGH
Non-HDL Cholesterol (Calc): 185 mg/dL (calc) — ABNORMAL HIGH (ref <130)
Total CHOL/HDL Ratio: 5.2 (calc) — ABNORMAL HIGH (ref <5.0)
Triglycerides: 194 mg/dL — ABNORMAL HIGH (ref <150)

## 2019-02-15 ENCOUNTER — Encounter: Payer: Self-pay | Admitting: Osteopathic Medicine

## 2019-02-15 ENCOUNTER — Ambulatory Visit (INDEPENDENT_AMBULATORY_CARE_PROVIDER_SITE_OTHER): Payer: BC Managed Care – PPO | Admitting: Osteopathic Medicine

## 2019-02-15 VITALS — BP 127/77

## 2019-02-15 DIAGNOSIS — E782 Mixed hyperlipidemia: Secondary | ICD-10-CM | POA: Diagnosis not present

## 2019-02-15 DIAGNOSIS — I10 Essential (primary) hypertension: Secondary | ICD-10-CM | POA: Diagnosis not present

## 2019-02-15 NOTE — Progress Notes (Signed)
Virtual Visit via Video (App used: doximity) Note  I connected with      Lorraine Patton on 02/15/19 at 8:30 by a telemedicine application and verified that I am speaking with the correct person using two identifiers.  Patient is at home I am in office   I discussed the limitations of evaluation and management by telemedicine and the availability of in person appointments. The patient expressed understanding and agreed to proceed.  History of Present Illness: Lorraine Patton is a 53 y.o. female who would like to discuss lab results and BP follow-up   Patient is checking blood pressure at home, numbers as below. No chest pain, pressure, shortness of breath, headache, dizziness. Cholesterol numbers are borderline, patient thinks there is some room for improvement with diet/exercise, she has also started an over-the-counter fish oil supplementation.   BP Readings from Last 3 Encounters:  02/15/19 127/77, 125/82, 133/91  01/30/19 (!) 170/97  07/30/18 121/83     The 10-year ASCVD risk score Mikey Bussing DC Jr., et al., 2013) is: 3.7%   Values used to calculate the score:     Age: 43 years     Sex: Female     Is Non-Hispanic African American: No     Diabetic: No     Tobacco smoker: No     Systolic Blood Pressure: 229 mmHg     Is BP treated: Yes     HDL Cholesterol: 44 mg/dL     Total Cholesterol: 229 mg/dL       Observations/Objective: BP 127/77 Comment: Pt reported per home monitor BP Readings from Last 3 Encounters:  02/15/19 127/77  01/30/19 (!) 170/97  07/30/18 121/83   Exam: Normal Speech.  NAD  Lab and Radiology Results No results found for this or any previous visit (from the past 72 hour(s)). No results found.     Assessment and Plan: 53 y.o. female with The primary encounter diagnosis was White coat syndrome with hypertension. A diagnosis of Mixed hyperlipidemia was also pertinent to this visit.   PDMP not reviewed this encounter. No orders of the defined  types were placed in this encounter.  No orders of the defined types were placed in this encounter.  Patient Instructions  Goal blood pressure: 130 top number or less, 80 bottom number or less.  If numbers start creeping up more toward 140/90, please let me know, we might need to adjust medications at that point.   Triglycerides and LDL/bad cholesterol were a little bit on the high side.  We can see how the omega-3/fish oil works over the next 6 months or so, also work on [employer] and increasing exercise as tolerated/able.  We have the option to trial a low-dose cholesterol medication if you would like to do that/if the numbers are not looking better.  You will get a MyChart message in 6 months to remind you to come into the lab, orders are in place for you to come in fasting, no appointment is necessary!  I will let you know once I review the results and then we can decide what to do from there  Any questions or problems in the meantime, please let us know!     Instructions sent via MyChart. If MyChart not available, pt was given option for info via personal e-mail w/ no guarantee of protected health info over unsecured e-mail communication, and MyChart sign-up instructions were sent to patient.   Follow Up Instructions: Return in about 6 months (around 08/16/2019)  for LAB VISIT recheck cholesterol numbers.    I discussed the assessment and treatment plan with the patient. The patient was provided an opportunity to ask questions and all were answered. The patient agreed with the plan and demonstrated an understanding of the instructions.   The patient was advised to call back or seek an in-person evaluation if any new concerns, if symptoms worsen or if the condition fails to improve as anticipated.  15 minutes of non-face-to-face time was provided during this encounter.      . . . . . . . . . . . . . Marland Kitchen                   Historical  information moved to improve visibility of documentation.  Past Medical History:  Diagnosis Date  . Abnormal Pap smear of cervix 1996  . Essential (primary) hypertension 08/23/2014   white coat syndrome - needs to bring home cuff for verification at all visits  . HLD (hyperlipidemia) 02/15/2014   Past Surgical History:  Procedure Laterality Date  . AUGMENTATION MAMMAPLASTY    . BREAST ENHANCEMENT SURGERY  2016 and 1997  . CRYOTHERAPY  1996   Social History   Tobacco Use  . Smoking status: Never Smoker  . Smokeless tobacco: Never Used  Substance Use Topics  . Alcohol use: Yes    Alcohol/week: 0.0 standard drinks   family history includes Cancer in her sister; Diabetes in her maternal grandmother; HIV/AIDS in her brother; Heart disease in her maternal grandfather; Hyperlipidemia in her maternal uncle; Myelodysplastic syndrome in her sister.  Medications: Current Outpatient Medications  Medication Sig Dispense Refill  . Bacillus Coagulans-Inulin (PROBIOTIC) 1-250 BILLION-MG CAPS Take by mouth.    . calcium carbonate (OS-CAL - DOSED IN MG OF ELEMENTAL CALCIUM) 1250 (500 Ca) MG tablet Take 1 tablet by mouth.    . ELDERBERRY PO Take 1 capsule by mouth daily.    . hydrochlorothiazide (HYDRODIURIL) 12.5 MG tablet Take 1 tablet (12.5 mg total) by mouth daily. 90 tablet 3  . magnesium 30 MG tablet Take 30 mg by mouth 2 (two) times daily.    . Melatonin 5 MG TABS Take 1 tablet by mouth daily.    . Multiple Vitamins-Minerals (MULTIPLE VITAMINS/WOMENS PO) Take 1 tablet by mouth daily.    . Omega-3 Fatty Acids (FISH OIL PO) Take 1 capsule by mouth daily.    . Turmeric 500 MG TABS Take 1 tablet by mouth daily.     No current facility-administered medications for this visit.    No Known Allergies

## 2019-02-15 NOTE — Patient Instructions (Addendum)
Goal blood pressure: 130 top number or less, 80 bottom number or less.  If numbers start creeping up more toward 140/90, please let me know, we might need to adjust medications at that point.   Triglycerides and LDL/bad cholesterol were a little bit on the high side.  We can see how the omega-3/fish oil works over the next 6 months or so, also work on [employer] and increasing exercise as tolerated/able.  We have the option to trial a low-dose cholesterol medication if you would like to do that/if the numbers are not looking better.  You will get a MyChart message in 6 months to remind you to come into the lab, orders are in place for you to come in fasting, no appointment is necessary!  I will let you know once I review the results and then we can decide what to do from there  Any questions or problems in the meantime, please let us know!

## 2019-02-18 ENCOUNTER — Telehealth: Payer: BC Managed Care – PPO | Admitting: Emergency Medicine

## 2019-02-18 DIAGNOSIS — L503 Dermatographic urticaria: Secondary | ICD-10-CM | POA: Diagnosis not present

## 2019-02-18 DIAGNOSIS — R300 Dysuria: Secondary | ICD-10-CM

## 2019-02-18 DIAGNOSIS — D225 Melanocytic nevi of trunk: Secondary | ICD-10-CM | POA: Diagnosis not present

## 2019-02-18 MED ORDER — PHENAZOPYRIDINE HCL 200 MG PO TABS: 200.0000 mg | ORAL_TABLET | Freq: Three times a day (TID) | ORAL | 0 refills | Status: DC | PRN

## 2019-02-18 MED ORDER — CEPHALEXIN 500 MG PO CAPS: 500.0000 mg | ORAL_CAPSULE | Freq: Two times a day (BID) | ORAL | 0 refills | Status: AC

## 2019-02-18 NOTE — Progress Notes (Signed)
Time spent: 10 min  MDM: uncomplicated dysuria. No high risk symptoms reported.  Chart reviewed and pt appears to be low risk.  Rx keflex, pyridum. F/u instructions given.   Lorraine Patton,   We are sorry that you are not feeling well.  Here is how we plan to help!  Based on what you shared with me it looks like you most likely have a simple urinary tract infection.  You did not report any concerning high risk symptoms like fever, abdominal or flank pain, inability to urinate or retention, abnormal vaginal discharge or bleeding, vomiting.  A UTI (Urinary Tract Infection) is a bacterial infection of the bladder.  Most cases of urinary tract infections are simple to treat but a key part of your care is to encourage you to drink plenty of fluids and watch your symptoms carefully.  I have prescribed keflex which is an antibiotic.  Pyridium is a medicine that helps numb the bladder and can help with burning and discomfort with urination.  This medicine can cause urine to turn bright orange or yellow, this should resolve after medicine.  Your symptoms should gradually improve.   Urinary tract infections can be prevented by drinking plenty of water to keep your body hydrated.  Also be sure when you wipe, wipe from front to back and don't hold it in!  If possible, empty your bladder every 4 hours.  SEEK IMMEDIATE MEDICAL CARE IF: - You develop fever greater than 100.4 F - You develop abdominal or flank pain - You develop vomiting, diarrhea, abnormal vaginal symptoms - You develop inability to void urine or retention of urine   Your e-visit answers were reviewed by a board certified advanced clinical practitioner to complete your personal care plan.  Depending on the condition, your plan could have included both over the counter or prescription medications.  If there is a problem please reply once you have received a response from your provider.  Your safety is important to [place].  If you have drug  allergies check your prescription carefully.    You can use MyChart to ask questions about today's visit, request a non-urgent call back, or ask for a work or school excuse for 24 hours related to this e-Visit. If it has been greater than 24 hours you will need to follow up with your provider, or enter a new e-Visit to address those concerns.   You will get an e-mail in the next two days asking about your experience.  I hope that your e-visit has been valuable and will speed your recovery. Thank you for using e-visits.  I hope you feel better!  Carmon Sails, PA-C

## 2019-02-21 ENCOUNTER — Ambulatory Visit (INDEPENDENT_AMBULATORY_CARE_PROVIDER_SITE_OTHER): Payer: BC Managed Care – PPO

## 2019-02-21 ENCOUNTER — Other Ambulatory Visit: Payer: Self-pay

## 2019-02-21 DIAGNOSIS — Z1231 Encounter for screening mammogram for malignant neoplasm of breast: Secondary | ICD-10-CM | POA: Diagnosis not present

## 2019-02-25 ENCOUNTER — Encounter: Payer: Self-pay | Admitting: Osteopathic Medicine

## 2019-02-25 DIAGNOSIS — R300 Dysuria: Secondary | ICD-10-CM

## 2019-02-25 NOTE — Telephone Encounter (Signed)
Pended order for UA and urine culture to [facility] done [facility]   Please advise

## 2019-02-28 LAB — URINALYSIS
Bilirubin Urine: NEGATIVE
Glucose, UA: NEGATIVE
Hgb urine dipstick: NEGATIVE
Ketones, ur: NEGATIVE
Leukocytes,Ua: NEGATIVE
Nitrite: NEGATIVE
Protein, ur: NEGATIVE
Specific Gravity, Urine: 1.009 (ref 1.001–1.03)
pH: <=5 (ref 5.0–8.0)

## 2019-02-28 LAB — URINE CULTURE
MICRO NUMBER:: 1200183
SPECIMEN QUALITY:: ADEQUATE

## 2019-04-24 DIAGNOSIS — I8312 Varicose veins of left lower extremity with inflammation: Secondary | ICD-10-CM | POA: Diagnosis not present

## 2019-04-24 DIAGNOSIS — I8311 Varicose veins of right lower extremity with inflammation: Secondary | ICD-10-CM | POA: Diagnosis not present

## 2019-05-09 ENCOUNTER — Telehealth: Payer: BC Managed Care – PPO | Admitting: Osteopathic Medicine

## 2019-06-26 DIAGNOSIS — I8311 Varicose veins of right lower extremity with inflammation: Secondary | ICD-10-CM | POA: Diagnosis not present

## 2019-06-26 DIAGNOSIS — I8312 Varicose veins of left lower extremity with inflammation: Secondary | ICD-10-CM | POA: Diagnosis not present

## 2019-07-19 ENCOUNTER — Other Ambulatory Visit: Payer: Self-pay | Admitting: Osteopathic Medicine

## 2019-08-16 LAB — LIPID PANEL
Cholesterol: 253 mg/dL — ABNORMAL HIGH (ref <200)
HDL: 44 mg/dL — ABNORMAL LOW (ref >=50)
LDL Cholesterol (Calc): 168 mg/dL (calc) — ABNORMAL HIGH
Non-HDL Cholesterol (Calc): 209 mg/dL (calc) — ABNORMAL HIGH (ref <130)
Total CHOL/HDL Ratio: 5.8 (calc) — ABNORMAL HIGH (ref <5.0)
Triglycerides: 253 mg/dL — ABNORMAL HIGH (ref <150)

## 2019-08-19 ENCOUNTER — Encounter: Payer: Self-pay | Admitting: Osteopathic Medicine

## 2019-08-19 DIAGNOSIS — E782 Mixed hyperlipidemia: Secondary | ICD-10-CM

## 2019-08-21 ENCOUNTER — Encounter: Payer: Self-pay | Admitting: Osteopathic Medicine

## 2019-08-22 MED ORDER — ATORVASTATIN CALCIUM 20 MG PO TABS: 20.0000 mg | ORAL_TABLET | Freq: Every day | ORAL | 3 refills | Status: DC

## 2019-08-22 NOTE — Telephone Encounter (Signed)
Result notes from labs state the following:  "Cholesterol looking a bit worse from where we were at 6 months ago. LDL/bad cholesterol went from 152 up to 168. Triglycerides went from 194 up to 253. If you are already on an omega-3 supplement, I think numbers like this most likely means genetically high cholesterol and we should strongly consider prescription medication to lower these numbers and reduce long-term risk of heart attack/stroke. If you are okay with me sending a medication, let me know. If we would rather wait and monitor, that is fine too but if numbers not looking better in another 6 months would more strongly consider medication. Let me know if any other questions!"  Note to covering provider

## 2019-10-14 ENCOUNTER — Other Ambulatory Visit: Payer: Self-pay | Admitting: Osteopathic Medicine

## 2019-11-11 ENCOUNTER — Encounter: Payer: Self-pay | Admitting: Osteopathic Medicine

## 2019-11-14 LAB — COMPLETE METABOLIC PANEL WITH GFR
AG Ratio: 2 (calc) (ref 1.0–2.5)
ALT: 39 U/L — ABNORMAL HIGH (ref 6–29)
AST: 24 U/L (ref 10–35)
Albumin: 4.5 g/dL (ref 3.6–5.1)
Alkaline phosphatase (APISO): 71 U/L (ref 37–153)
BUN/Creatinine Ratio: 14 (calc) (ref 6–22)
BUN: 15 mg/dL (ref 7–25)
CO2: 27 mmol/L (ref 20–32)
Calcium: 9.6 mg/dL (ref 8.6–10.4)
Chloride: 106 mmol/L (ref 98–110)
Creat: 1.07 mg/dL — ABNORMAL HIGH (ref 0.50–1.05)
GFR, Est African American: 67 mL/min/{1.73_m2} (ref >=60)
GFR, Est Non African American: 58 mL/min/{1.73_m2} — ABNORMAL LOW (ref >=60)
Globulin: 2.3 g/dL (calc) (ref 1.9–3.7)
Glucose, Bld: 93 mg/dL (ref 65–99)
Potassium: 4.2 mmol/L (ref 3.5–5.3)
Sodium: 141 mmol/L (ref 135–146)
Total Bilirubin: 0.5 mg/dL (ref 0.2–1.2)
Total Protein: 6.8 g/dL (ref 6.1–8.1)

## 2019-11-14 LAB — LIPID PANEL
Cholesterol: 171 mg/dL (ref <200)
HDL: 42 mg/dL — ABNORMAL LOW (ref >=50)
LDL Cholesterol (Calc): 98 mg/dL (calc)
Non-HDL Cholesterol (Calc): 129 mg/dL (calc) (ref <130)
Total CHOL/HDL Ratio: 4.1 (calc) (ref <5.0)
Triglycerides: 222 mg/dL — ABNORMAL HIGH (ref <150)

## 2019-12-05 ENCOUNTER — Telehealth: Payer: BC Managed Care – PPO | Admitting: Osteopathic Medicine

## 2019-12-24 ENCOUNTER — Telehealth (INDEPENDENT_AMBULATORY_CARE_PROVIDER_SITE_OTHER): Payer: BC Managed Care – PPO | Admitting: Osteopathic Medicine

## 2019-12-24 ENCOUNTER — Encounter: Payer: Self-pay | Admitting: Osteopathic Medicine

## 2019-12-24 VITALS — BP 140/90 | Wt 190.0 lb

## 2019-12-24 DIAGNOSIS — E781 Pure hyperglyceridemia: Secondary | ICD-10-CM | POA: Diagnosis not present

## 2019-12-24 DIAGNOSIS — I10 Essential (primary) hypertension: Secondary | ICD-10-CM

## 2019-12-24 DIAGNOSIS — E782 Mixed hyperlipidemia: Secondary | ICD-10-CM | POA: Diagnosis not present

## 2019-12-24 MED ORDER — HYDROCHLOROTHIAZIDE 12.5 MG PO TABS: 12.5000 mg | ORAL_TABLET | Freq: Every day | ORAL | 3 refills | Status: DC

## 2019-12-24 MED ORDER — ICOSAPENT ETHYL 1 G PO CAPS: 2.0000 g | ORAL_CAPSULE | Freq: Two times a day (BID) | ORAL | 0 refills | Status: DC

## 2019-12-24 NOTE — Progress Notes (Signed)
Virtual Visit via Video (App used: MyChart) Note  I connected with      Lorraine Patton on 12/24/19 at 8:15 AM  by a telemedicine application and verified that I am speaking with the correct person using two identifiers.  Patient is at home I am in office   I discussed the limitations of evaluation and management by telemedicine and the availability of in person appointments. The patient expressed understanding and agreed to proceed.  History of Present Illness: Lorraine Patton is a 54 y.o. female who would like to discuss triglycerides   High TG on labs, persistent despite initiation of statin LDL has improved on statin!  Diligent frequent exercise, plant based low carb diet most days, meats on weekends, some fatty fish        Observations/Objective: BP 140/90   Wt 190 lb (86.2 kg)   BMI 29.76 kg/m  BP Readings from Last 3 Encounters:  02/15/19 127/77  01/30/19 (!) 170/97  07/30/18 121/83   Exam: Normal Speech.  NAD  Lab and Radiology Results No results found for this or any previous visit (from the past 72 hour(s)). No results found.     Assessment and Plan: 54 y.o. female with The primary encounter diagnosis was Hypertriglyceridemia. Diagnoses of Essential hypertension and Mixed hyperlipidemia were also pertinent to this visit.  --> Rx Omega-3 (icosapent)  --> increase fatty fish intake  --> continue otherwise exercise/diet   PDMP not reviewed this encounter. No orders of the defined types were placed in this encounter.  Meds ordered this encounter  Medications  . hydrochlorothiazide (HYDRODIURIL) 12.5 MG tablet    Sig: Take 1 tablet (12.5 mg total) by mouth daily.    Dispense:  90 tablet    Refill:  3  . icosapent Ethyl (VASCEPA) 1 g capsule    Sig: Take 2 capsules (2 g total) by mouth 2 (two) times daily.    Dispense:  360 capsule    Refill:  0    If authorization needed, high TG failed statin and OTC Omega 3, comorbid HTN     Follow Up  Instructions: Return for labs 6 weeks after starting Vascepa.    I discussed the assessment and treatment plan with the patient. The patient was provided an opportunity to ask questions and all were answered. The patient agreed with the plan and demonstrated an understanding of the instructions.   The patient was advised to call back or seek an in-person evaluation if any new concerns, if symptoms worsen or if the condition fails to improve as anticipated.  30 minutes of non-face-to-face time was provided during this encounter.      . . . . . . . . . . . . . Marland Kitchen                   Historical information moved to improve visibility of documentation.  Past Medical History:  Diagnosis Date  . Abnormal Pap smear of cervix 1996  . Essential (primary) hypertension 08/23/2014   white coat syndrome - needs to bring home cuff for verification at all visits  . HLD (hyperlipidemia) 02/15/2014   Past Surgical History:  Procedure Laterality Date  . AUGMENTATION MAMMAPLASTY    . BREAST ENHANCEMENT SURGERY  2016 and 1997  . CRYOTHERAPY  1996   Social History   Tobacco Use  . Smoking status: Never Smoker  . Smokeless tobacco: Never Used  Substance Use Topics  . Alcohol use: Yes  Alcohol/week: 0.0 standard drinks   family history includes Cancer in her sister; Diabetes in her maternal grandmother; HIV/AIDS in her brother; Heart disease in her maternal grandfather; Hyperlipidemia in her maternal uncle; Myelodysplastic syndrome in her sister.  Medications: Current Outpatient Medications  Medication Sig Dispense Refill  . atorvastatin (LIPITOR) 20 MG tablet Take 1 tablet (20 mg total) by mouth daily. 90 tablet 3  . Bacillus Coagulans-Inulin (PROBIOTIC) 1-250 BILLION-MG CAPS Take by mouth.    . calcium carbonate (OS-CAL - DOSED IN MG OF ELEMENTAL CALCIUM) 1250 (500 Ca) MG tablet Take 1 tablet by mouth.    . ELDERBERRY PO Take 1 capsule by mouth daily.    .  hydrochlorothiazide (HYDRODIURIL) 12.5 MG tablet TAKE 1 TABLET (12.5 MG TOTAL) BY MOUTH DAILY. NEEDS APPT 90 tablet 0  . magnesium 30 MG tablet Take 30 mg by mouth 2 (two) times daily.    . Melatonin 5 MG TABS Take 1 tablet by mouth daily.    . Multiple Vitamins-Minerals (MULTIPLE VITAMINS/WOMENS PO) Take 1 tablet by mouth daily.    . Omega-3 Fatty Acids (FISH OIL PO) Take 1 capsule by mouth daily.    . phenazopyridine (PYRIDIUM) 200 MG tablet Take 1 tablet (200 mg total) by mouth 3 (three) times daily as needed for pain. 10 tablet 0  . Turmeric 500 MG TABS Take 1 tablet by mouth daily.     No current facility-administered medications for this visit.   No Known Allergies

## 2020-01-16 DIAGNOSIS — Z1231 Encounter for screening mammogram for malignant neoplasm of breast: Secondary | ICD-10-CM

## 2020-01-31 ENCOUNTER — Other Ambulatory Visit: Payer: Self-pay | Admitting: Osteopathic Medicine

## 2020-01-31 DIAGNOSIS — Z1231 Encounter for screening mammogram for malignant neoplasm of breast: Secondary | ICD-10-CM

## 2020-02-19 DIAGNOSIS — L578 Other skin changes due to chronic exposure to nonionizing radiation: Secondary | ICD-10-CM | POA: Diagnosis not present

## 2020-02-19 DIAGNOSIS — D225 Melanocytic nevi of trunk: Secondary | ICD-10-CM | POA: Diagnosis not present

## 2020-02-23 ENCOUNTER — Encounter: Payer: Self-pay | Admitting: Osteopathic Medicine

## 2020-02-24 ENCOUNTER — Telehealth: Payer: Self-pay | Admitting: *Deleted

## 2020-02-24 DIAGNOSIS — R300 Dysuria: Secondary | ICD-10-CM

## 2020-02-24 DIAGNOSIS — R350 Frequency of micturition: Secondary | ICD-10-CM

## 2020-02-24 NOTE — Telephone Encounter (Signed)
Labs ordered for UTI sx per [employer].

## 2020-02-25 DIAGNOSIS — R350 Frequency of micturition: Secondary | ICD-10-CM | POA: Diagnosis not present

## 2020-02-25 DIAGNOSIS — R300 Dysuria: Secondary | ICD-10-CM | POA: Diagnosis not present

## 2020-02-26 ENCOUNTER — Encounter: Payer: Self-pay | Admitting: Osteopathic Medicine

## 2020-02-26 ENCOUNTER — Other Ambulatory Visit: Payer: Self-pay | Admitting: Medical-Surgical

## 2020-02-26 ENCOUNTER — Telehealth: Payer: Self-pay

## 2020-02-26 MED ORDER — PHENAZOPYRIDINE HCL 200 MG PO TABS: 200.0000 mg | ORAL_TABLET | Freq: Three times a day (TID) | ORAL | 0 refills | Status: DC | PRN

## 2020-02-26 MED ORDER — NITROFURANTOIN MONOHYD MACRO 100 MG PO CAPS: 100.0000 mg | ORAL_CAPSULE | Freq: Two times a day (BID) | ORAL | 0 refills | Status: DC

## 2020-02-26 NOTE — Telephone Encounter (Signed)
Message sent directly to patient.

## 2020-02-26 NOTE — Telephone Encounter (Signed)
Pt called and said that she saw the result message for her UA done on 02/25/2020 and the note was asking what atbx has worked for her in the past and asking about sx. She said that she is still symptomatic and wants and atbx sent in. She said that she sent a message through MyChart this morning and has not heard anything back. She said that every time she has sent a MyChart message that she does not get an answer before the end of the day. I told her that I see where she sent the message this morning at 7:51 AM and that the message was in Joy's basket so she will respond to the message once she is able to view her message and respond. She said that she wanted me to also send a telephone message to Ander Slade to make sure that this gets taken care of today.

## 2020-02-26 NOTE — Progress Notes (Signed)
Sending Pyridium prn and  Macrobid x 7 days for urinary symptoms. Culture pending. May need to change antibiotic depending on culture results.   Thayer Ohm, DNP, APRN, FNP-BC [facility]

## 2020-02-27 LAB — URINALYSIS
Bilirubin Urine: NEGATIVE
Glucose, UA: NEGATIVE
Ketones, ur: NEGATIVE
Nitrite: NEGATIVE
Protein, ur: NEGATIVE
Specific Gravity, Urine: 1.006 (ref 1.001–1.03)
pH: 6 (ref 5.0–8.0)

## 2020-02-27 LAB — URINE CULTURE
MICRO NUMBER:: 11316458
SPECIMEN QUALITY:: ADEQUATE

## 2020-03-23 ENCOUNTER — Telehealth: Payer: BC Managed Care – PPO | Admitting: Family

## 2020-03-23 DIAGNOSIS — H00015 Hordeolum externum left lower eyelid: Secondary | ICD-10-CM

## 2020-03-23 MED ORDER — BACITRACIN-POLYMYXIN B 500-10000 UNIT/GM OP OINT: 1.0000 "application " | TOPICAL_OINTMENT | Freq: Three times a day (TID) | OPHTHALMIC | 0 refills | Status: DC

## 2020-03-23 NOTE — Progress Notes (Signed)
We are sorry that you are not feeling well. Here is how we plan to help!  Based on what you have shared with me it looks like you have a stye.  A stye is an inflammation of the eyelid.  It is often a red, painful lump near the edge of the eyelid that may look like a boil or a pimple.  A stye develops when an infection occurs at the base of an eyelash.   We have made appropriate suggestions for you based upon your presentation: Simple styes can be treated without medical intervention.  Most styes either resolve spontaneously or resolve with simple home treatment by applying warm compresses or heated washcloth to the stye for about 10-15 minutes three to four times a day. This causes the stye to drain and resolve.  I sent in polysporin ointment that you will apply to the eye three times a day. Remember good hand hygiene.   HOME CARE:   Wash your hands often!  Let the stye open on its own. Don't squeeze or open it.  Don't rub your eyes. This can irritate your eyes and let in bacteria.  If you need to touch your eyes, wash your hands first.  Don't wear eye makeup or contact lenses until the area has healed.  GET HELP RIGHT AWAY IF:   Your symptoms do not improve.  You develop blurred or loss of vision.  Your symptoms worsen (increased discharge, pain or redness).  Thank you for choosing an e-visit.  Your e-visit answers were reviewed by a board certified advanced clinical practitioner to complete your personal care plan.  Depending upon the condition, your plan could have included both over the counter or prescription medications.  Please review your pharmacy choice.  Make sure the pharmacy is open so you can pick up prescription now.  If there is a problem, you may contact your provider through [employer] and have the prescription routed to another pharmacy.    Your safety is important to [place].  If you have drug allergies check your prescription carefully.  For the next 24 hours  you can use MyChart to ask questions about today's visit, request a non-urgent call back, or ask for a work or school excuse.  You will get an email in the next two days asking about your experience.  I hope you that your e-visit has been valuable and will speed your recovery.  Approximately 5 minutes was spent documenting and reviewing patient's chart.

## 2020-04-30 ENCOUNTER — Ambulatory Visit: Payer: BC Managed Care – PPO

## 2020-05-28 ENCOUNTER — Ambulatory Visit: Payer: BC Managed Care – PPO

## 2020-06-02 DIAGNOSIS — I8312 Varicose veins of left lower extremity with inflammation: Secondary | ICD-10-CM | POA: Diagnosis not present

## 2020-06-04 ENCOUNTER — Ambulatory Visit: Payer: BC Managed Care – PPO

## 2020-06-04 ENCOUNTER — Ambulatory Visit: Payer: BC Managed Care – PPO | Admitting: Obstetrics and Gynecology

## 2020-06-10 ENCOUNTER — Ambulatory Visit (INDEPENDENT_AMBULATORY_CARE_PROVIDER_SITE_OTHER): Payer: BC Managed Care – PPO

## 2020-06-10 ENCOUNTER — Other Ambulatory Visit: Payer: Self-pay

## 2020-06-10 DIAGNOSIS — Z1231 Encounter for screening mammogram for malignant neoplasm of breast: Secondary | ICD-10-CM | POA: Diagnosis not present

## 2020-06-12 ENCOUNTER — Other Ambulatory Visit: Payer: Self-pay | Admitting: Osteopathic Medicine

## 2020-06-12 DIAGNOSIS — R928 Other abnormal and inconclusive findings on diagnostic imaging of breast: Secondary | ICD-10-CM

## 2020-06-22 ENCOUNTER — Encounter: Payer: Self-pay | Admitting: Obstetrics & Gynecology

## 2020-06-22 ENCOUNTER — Other Ambulatory Visit: Payer: Self-pay

## 2020-06-22 ENCOUNTER — Ambulatory Visit (INDEPENDENT_AMBULATORY_CARE_PROVIDER_SITE_OTHER): Payer: BC Managed Care – PPO | Admitting: Obstetrics & Gynecology

## 2020-06-22 VITALS — BP 158/89 | HR 114 | Resp 16 | Ht 67.0 in

## 2020-06-22 DIAGNOSIS — Z01419 Encounter for gynecological examination (general) (routine) without abnormal findings: Secondary | ICD-10-CM | POA: Diagnosis not present

## 2020-06-22 DIAGNOSIS — E782 Mixed hyperlipidemia: Secondary | ICD-10-CM

## 2020-06-22 DIAGNOSIS — N951 Menopausal and female climacteric states: Secondary | ICD-10-CM | POA: Diagnosis not present

## 2020-06-22 DIAGNOSIS — R030 Elevated blood-pressure reading, without diagnosis of hypertension: Secondary | ICD-10-CM | POA: Diagnosis not present

## 2020-06-22 NOTE — Progress Notes (Signed)
Subjective:     Lorraine Patton is a 55 y.o. female here for a routine exam.  Current complaints: diagnostic mammogram upcoming.  Some hot flashes but manageable.     Gynecologic History Patient's last menstrual period was 01/16/2018 (approximate). Contraception: post menopausal status Last Pap: 2020. Results were: normal Last mammogram: FINDINGS: The patient has retropectoral implants. In the left breast, a possible mass warrants further evaluation. In the right breast, no findings suspicious for malignancy.  Obstetric History OB History  Gravida Para Term Preterm AB Living  2 2 2     2   SAB IAB Ectopic Multiple Live Births          2    # Outcome Date GA Lbr Len/2nd Weight Sex Delivery Anes PTL Lv  2 Term      Vag-Spont   LIV  1 Term      Vag-Spont   LIV     The following portions of the patient's history were reviewed and updated as appropriate: allergies, current medications, past family history, past medical history, past social history, past surgical history and problem list.  Review of Systems Pertinent items noted in HPI and remainder of comprehensive ROS otherwise negative.    Objective:      Vitals:   06/22/20 0909  BP: (!) 158/89  Pulse: (!) 114  Resp: 16  Height: 5\' 7"  (1.702 m)   Vitals:  WNL General appearance: alert, cooperative and no distress  HEENT: Normocephalic, without obvious abnormality, atraumatic Eyes: negative Throat: lips, mucosa, and tongue normal; teeth and gums normal  Respiratory: Clear to auscultation bilaterally  CV: Regular rate and rhythm  Breasts:  Normal appearance, no masses or tenderness, no nipple retraction or dimpling; implants noted  GI: Soft, non-tender; bowel sounds normal; no masses,  no organomegaly  GU: External Genitalia:  Tanner V, no lesion Urethra:  No prolapse   Vagina: Pink, normal rugae, no blood or discharge  Cervix: No CMT, no lesion  Uterus:  Normal size and contour, non tender  Adnexa: Normal, no masses,  non tender  Musculoskeletal: No edema, redness or tenderness in the calves or thighs  Skin: No lesions or rash  Lymphatic: Axillary adenopathy: none     Psychiatric: Normal mood and behavior    Assessment:    Healthy female exam.    Plan:   1.  Pap smear 2023 2.  Diagnostic mammogram as above 3.  Colonoscopy up to date (done at 48) 4.  Sees Dr. for other health maintenance

## 2020-06-22 NOTE — Progress Notes (Signed)
Pt declines weight.  Abnormal mammogram and diagnostic imaging  On 07/06/20

## 2020-07-06 ENCOUNTER — Other Ambulatory Visit: Payer: Self-pay

## 2020-07-06 ENCOUNTER — Ambulatory Visit
Admission: RE | Admit: 2020-07-06 | Discharge: 2020-07-06 | Disposition: A | Discharge status: Home / Self Care | Payer: BC Managed Care – PPO | Source: Ambulatory Visit | Attending: Osteopathic Medicine | Admitting: Osteopathic Medicine

## 2020-07-06 DIAGNOSIS — R928 Other abnormal and inconclusive findings on diagnostic imaging of breast: Secondary | ICD-10-CM

## 2020-07-06 DIAGNOSIS — N6002 Solitary cyst of left breast: Secondary | ICD-10-CM | POA: Diagnosis not present

## 2020-07-07 NOTE — Progress Notes (Signed)
[place] of left breast shows a cyst. Resume yearly screening mammograms.

## 2020-07-09 LAB — LIPID PANEL W/REFLEX DIRECT LDL
Cholesterol: 173 mg/dL (ref <200)
HDL: 45 mg/dL — ABNORMAL LOW (ref >=50)
LDL Cholesterol (Calc): 96 mg/dL (calc)
Non-HDL Cholesterol (Calc): 128 mg/dL (calc) (ref <130)
Total CHOL/HDL Ratio: 3.8 (calc) (ref <5.0)
Triglycerides: 220 mg/dL — ABNORMAL HIGH (ref <150)

## 2020-07-09 LAB — COMPLETE METABOLIC PANEL WITH GFR
AG Ratio: 1.8 (calc) (ref 1.0–2.5)
ALT: 30 U/L — ABNORMAL HIGH (ref 6–29)
AST: 20 U/L (ref 10–35)
Albumin: 4.4 g/dL (ref 3.6–5.1)
Alkaline phosphatase (APISO): 80 U/L (ref 37–153)
BUN: 18 mg/dL (ref 7–25)
CO2: 24 mmol/L (ref 20–32)
Calcium: 9.5 mg/dL (ref 8.6–10.4)
Chloride: 107 mmol/L (ref 98–110)
Creat: 0.96 mg/dL (ref 0.50–1.05)
GFR, Est African American: 77 mL/min/{1.73_m2} (ref >=60)
GFR, Est Non African American: 66 mL/min/{1.73_m2} (ref >=60)
Globulin: 2.4 g/dL (calc) (ref 1.9–3.7)
Glucose, Bld: 94 mg/dL (ref 65–99)
Potassium: 4 mmol/L (ref 3.5–5.3)
Sodium: 141 mmol/L (ref 135–146)
Total Bilirubin: 0.5 mg/dL (ref 0.2–1.2)
Total Protein: 6.8 g/dL (ref 6.1–8.1)

## 2020-07-14 ENCOUNTER — Ambulatory Visit: Payer: BC Managed Care – PPO | Admitting: Osteopathic Medicine

## 2020-07-23 ENCOUNTER — Encounter: Payer: Self-pay | Admitting: Osteopathic Medicine

## 2020-07-23 ENCOUNTER — Ambulatory Visit (INDEPENDENT_AMBULATORY_CARE_PROVIDER_SITE_OTHER): Payer: BC Managed Care – PPO | Admitting: Osteopathic Medicine

## 2020-07-23 ENCOUNTER — Other Ambulatory Visit: Payer: Self-pay

## 2020-07-23 VITALS — BP 145/82 | HR 106 | Temp 98.4°F

## 2020-07-23 DIAGNOSIS — E781 Pure hyperglyceridemia: Secondary | ICD-10-CM | POA: Diagnosis not present

## 2020-07-23 DIAGNOSIS — E782 Mixed hyperlipidemia: Secondary | ICD-10-CM

## 2020-07-23 DIAGNOSIS — I10 Essential (primary) hypertension: Secondary | ICD-10-CM | POA: Diagnosis not present

## 2020-07-23 MED ORDER — HYDROCHLOROTHIAZIDE 12.5 MG PO TABS: 25.0000 mg | ORAL_TABLET | Freq: Every day | ORAL | 3 refills | Status: DC

## 2020-07-23 NOTE — Patient Instructions (Addendum)
Plan:  Cholesterol: Continue Lipitor (atorvastatin) Faxed Rx for Vascepa generic If this goes through, great! If any issues will send fenofibrate   Blood pressure: Increase hydrochlorothiazide from 12.5 mg to 25 mg  BP goal <130/80

## 2020-07-23 NOTE — Progress Notes (Signed)
Lorraine Patton is a 55 y.o. female who presents to  [facility] at [facility]  today, 07/23/20, seeking care for the following:  . Follow HTN and Lipids     ASSESSMENT & PLAN with other pertinent findings:  The primary encounter diagnosis was Mixed hyperlipidemia. Diagnoses of White coat syndrome with hypertension and Familial hypertriglyceridemia were also pertinent to this visit.   1. Mixed hyperlipidemia 3. Familial hypertriglyceridemia Rx for Vascepa through savings program Labs reviewed w/ patient  See pt instructions  2. White coat syndrome with hypertension BP Readings from Last 3 Encounters:  07/23/20 (!) 145/82  06/22/20 (!) 158/89  12/24/19 140/90  HCTZ 12.5 --> 25     Patient Instructions  Plan:  Cholesterol: Continue Lipitor (atorvastatin) Faxed Rx for Vascepa generic If this goes through, great! If any issues will send fenofibrate   Blood pressure: Increase hydrochlorothiazide from 12.5 mg to 25 mg  BP goal <130/80      Orders Placed This Encounter  Procedures  . Lipid Panel w/reflex Direct LDL  . COMPLETE METABOLIC PANEL WITH GFR  . TSH    No orders of the defined types were placed in this encounter.    See below for relevant physical exam findings  See below for recent lab and imaging results reviewed  Medications, allergies, PMH, PSH, SocH, FamH reviewed below    Follow-up instructions: Return in about 3 months (around 10/23/2020) for recheck labs (choelsterol) and blood pressure. .                                        Exam:  BP (!) 145/82 (BP Location: Left Arm, Patient Position: Sitting, Cuff Size: Large)   Pulse (!) 106   Temp 98.4 F (36.9 C)   LMP 01/16/2018 (Approximate)   Constitutional: VS see above. General Appearance: alert, well-developed, well-nourished, NAD  Neck: No masses, trachea midline.   Respiratory: Normal respiratory effort.    Musculoskeletal: Gait normal.   Neurological: Normal balance/coordination. No tremor.  Skin: warm, dry, intact.   Psychiatric: Normal judgment/insight. Normal mood and affect. Oriented x3.   Current Meds  Medication Sig  . atorvastatin (LIPITOR) 20 MG tablet Take 1 tablet (20 mg total) by mouth daily.  . calcium carbonate (OS-CAL - DOSED IN MG OF ELEMENTAL CALCIUM) 1250 (500 Ca) MG tablet Take 1 tablet by mouth.  . hydrochlorothiazide (HYDRODIURIL) 12.5 MG tablet Take 1 tablet (12.5 mg total) by mouth daily.  . magnesium 30 MG tablet Take 30 mg by mouth 2 (two) times daily.  . Melatonin 5 MG TABS Take 1 tablet by mouth daily.  . Multiple Vitamins-Minerals (MULTIPLE VITAMINS/WOMENS PO) Take 1 tablet by mouth daily.  . Omega-3 Fatty Acids (FISH OIL PO) Take 1 capsule by mouth daily.  . Turmeric 500 MG TABS Take 1 tablet by mouth daily.    No Known Allergies  Patient Active Problem List   Diagnosis Date Noted  . Annual physical exam 12/02/2015  . White coat syndrome without diagnosis of hypertension 05/25/2015  . Cardiac risk counseling 05/25/2015  . Hyperlipidemia, unspecified 02/15/2014    Family History  Problem Relation Age of Onset  . Myelodysplastic syndrome Sister   . Cancer Sister   . HIV/AIDS Brother   . Diabetes Maternal Grandmother   . Heart disease Maternal Grandfather   . Hyperlipidemia Maternal Uncle  Social History   Tobacco Use  Smoking Status Never Smoker  Smokeless Tobacco Never Used    Past Surgical History:  Procedure Laterality Date  . AUGMENTATION MAMMAPLASTY Bilateral   . BREAST ENHANCEMENT SURGERY  2016 and 1997  . CRYOTHERAPY  1996    Immunization History  Administered Date(s) Administered  . Influenza Inj Mdck Quad Pf 12/03/2018  . Influenza Split 12/19/2014  . Influenza, Seasonal, Injecte, Preservative Fre 12/24/2013  . Influenza,inj,Quad PF,6+ Mos 12/02/2015, 12/14/2016, 12/22/2017  . Influenza-Unspecified 12/30/2010,  12/20/2011, 12/19/2014, 01/06/2020  . Janssen (J&J) SARS-COV-2 Vaccination 06/24/2019  . PFIZER(Purple Top)SARS-COV-2 Vaccination 06/24/2019, 02/05/2020, 07/08/2020  . Tdap 03/18/2013  . Zoster Recombinat (Shingrix) 01/06/2020, 04/16/2020    Recent Results (from the past 2160 hour(s))  Lipid Panel w/reflex Direct LDL     Status: Abnormal   Collection Time: 07/08/20  8:57 AM  Result Value Ref Range   Cholesterol 173 <200 mg/dL   HDL 45 (L) > OR = 50 mg/dL   Triglycerides 786 (H) <150 mg/dL    Comment: . If a non-fasting specimen was collected, consider repeat triglyceride testing on a fasting specimen if clinically indicated.  Perry Mount et al. J. of Clin. Lipidol. 2015;9:129-169. Marland Kitchen    LDL Cholesterol (Calc) 96 mg/dL (calc)    Comment: Reference range: <100 . Desirable range <100 mg/dL for primary prevention;   <70 mg/dL for patients with CHD or diabetic patients  with > or = 2 CHD risk factors. Marland Kitchen LDL-C is now calculated using the Martin-Hopkins  calculation, which is a validated novel method providing  better accuracy than the Friedewald equation in the  estimation of LDL-C.  Horald Pollen et al. Lenox Ahr. 7672;094(70): 2061-2068  (http://education.[facility].com/faq/FAQ164)    Total CHOL/HDL Ratio 3.8 <5.0 (calc)   Non-HDL Cholesterol (Calc) 128 <130 mg/dL (calc)    Comment: For patients with diabetes plus 1 major ASCVD risk  factor, treating to a non-HDL-C goal of <100 mg/dL  (LDL-C of <96 mg/dL) is considered a therapeutic  option.   COMPLETE METABOLIC PANEL WITH GFR     Status: Abnormal   Collection Time: 07/08/20  8:57 AM  Result Value Ref Range   Glucose, Bld 94 65 - 99 mg/dL    Comment: .            Fasting reference interval .    BUN 18 7 - 25 mg/dL   Creat 2.83 6.62 - 9.47 mg/dL    Comment: For patients >2 years of age, the reference limit for Creatinine is approximately 13% higher for people identified as African-American. .    GFR, Est Non African  American 66 > OR = 60 mL/min/1.41m2   GFR, Est African American 77 > OR = 60 mL/min/1.65m2   BUN/Creatinine Ratio NOT APPLICABLE 6 - 22 (calc)   Sodium 141 135 - 146 mmol/L   Potassium 4.0 3.5 - 5.3 mmol/L   Chloride 107 98 - 110 mmol/L   CO2 24 20 - 32 mmol/L   Calcium 9.5 8.6 - 10.4 mg/dL   Total Protein 6.8 6.1 - 8.1 g/dL   Albumin 4.4 3.6 - 5.1 g/dL   Globulin 2.4 1.9 - 3.7 g/dL (calc)   AG Ratio 1.8 1.0 - 2.5 (calc)   Total Bilirubin 0.5 0.2 - 1.2 mg/dL   Alkaline phosphatase (APISO) 80 37 - 153 U/L   AST 20 10 - 35 U/L   ALT 30 (H) 6 - 29 U/L    No results found.     All  questions at time of visit were answered - patient instructed to contact office with any additional concerns or updates. ER/RTC precautions were reviewed with the patient as applicable.   Please note: manual typing as well as voice recognition software may have been used to produce this document - typos may escape review. Please contact Dr. Lyn Hollingshead for any needed clarifications.

## 2020-07-24 DIAGNOSIS — I8312 Varicose veins of left lower extremity with inflammation: Secondary | ICD-10-CM | POA: Diagnosis not present

## 2020-07-28 ENCOUNTER — Encounter: Payer: Self-pay | Admitting: Osteopathic Medicine

## 2020-07-29 DIAGNOSIS — I8312 Varicose veins of left lower extremity with inflammation: Secondary | ICD-10-CM | POA: Diagnosis not present

## 2020-07-29 MED ORDER — ATORVASTATIN CALCIUM 20 MG PO TABS: 20.0000 mg | ORAL_TABLET | Freq: Every day | ORAL | 3 refills | Status: DC

## 2020-07-29 MED ORDER — ICOSAPENT ETHYL 1 G PO CAPS: 2.0000 g | ORAL_CAPSULE | Freq: Two times a day (BID) | ORAL | 3 refills | Status: DC

## 2020-07-30 IMAGING — MG DIGITAL SCREENING BILATERAL MAMMOGRAM WITH IMPLANTS, CAD AND TOM
8 of 12 series · 8 of 28 positions shown · non-contrast
Comparison: Previous exam(s).

CLINICAL DATA: Screening.

EXAM:
DIGITAL SCREENING BILATERAL MAMMOGRAM WITH IMPLANTS, CAD AND TOMO
The patient has retropectoral implants. Standard and implant
displaced views were performed.

[R MLO]
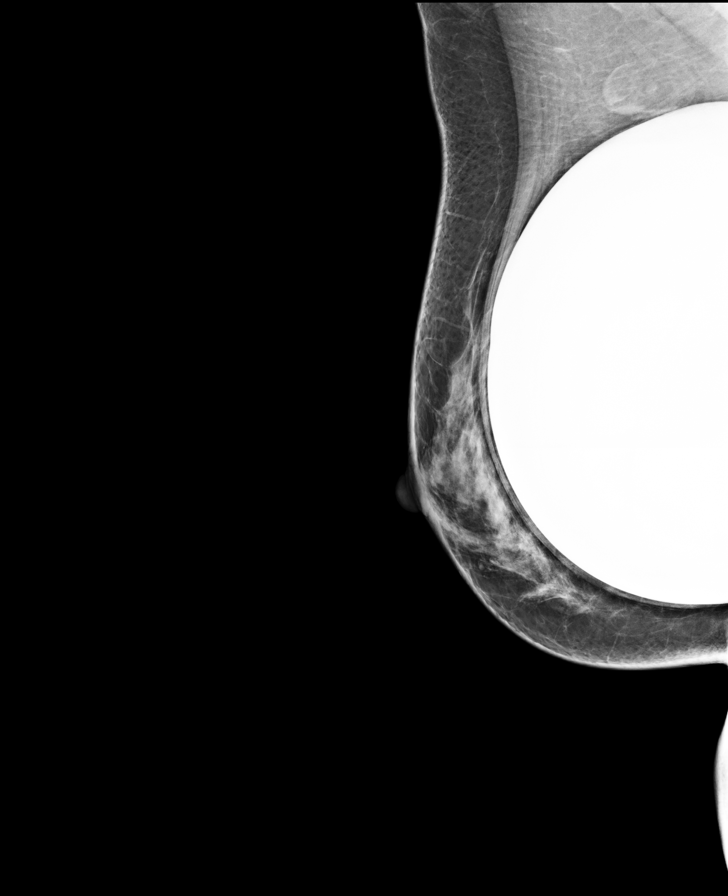

[L CC]
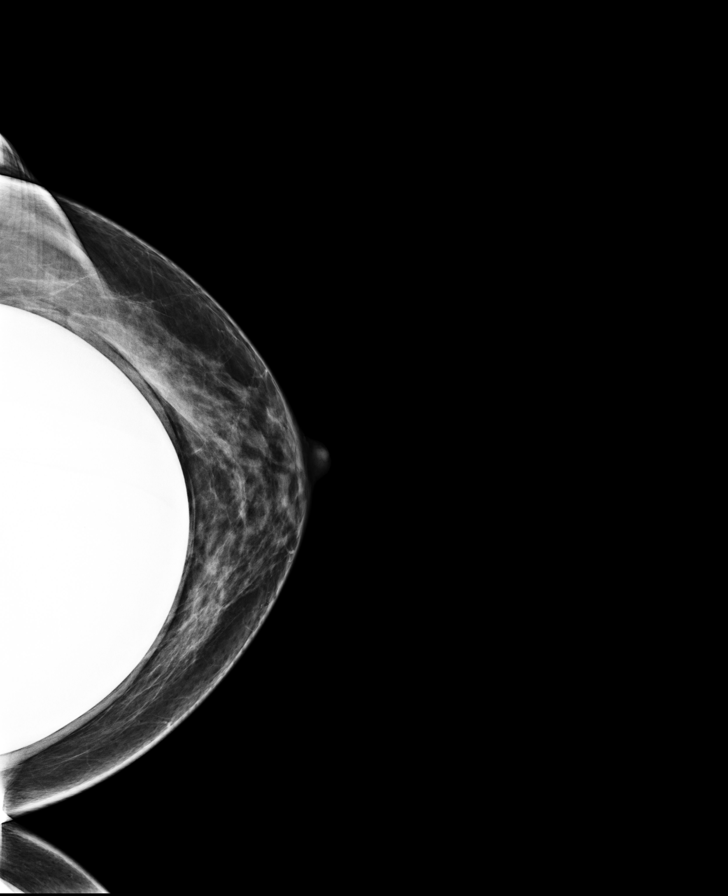

[L MLO]
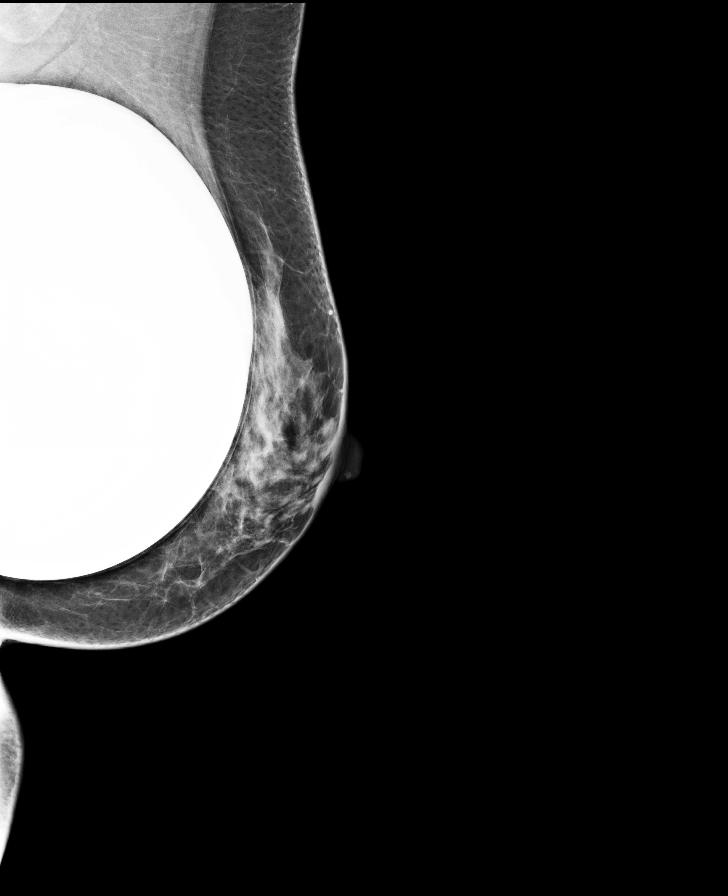

[R CC]
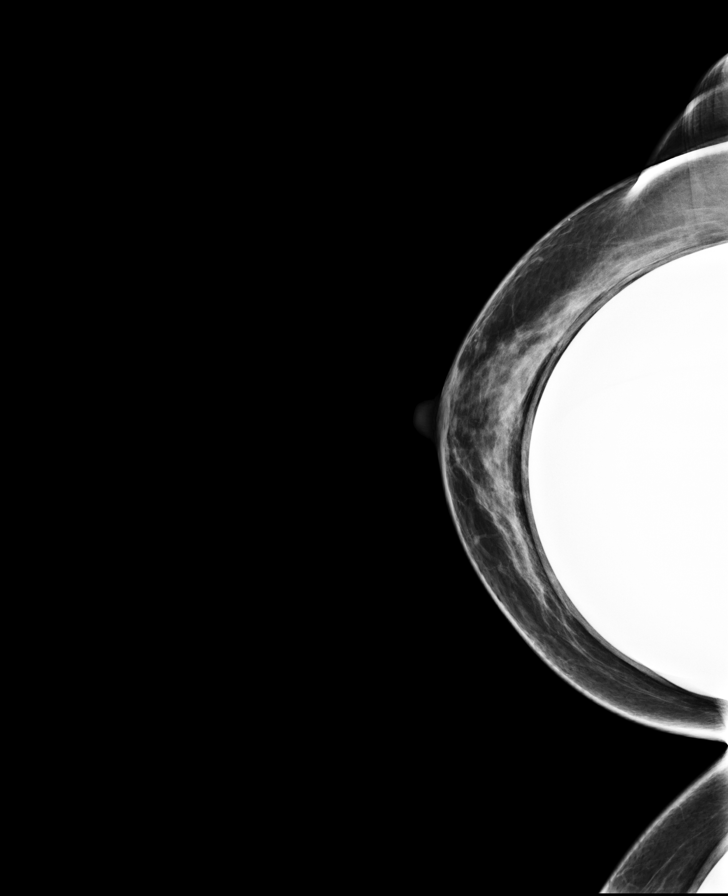

[R CC synth-2D]
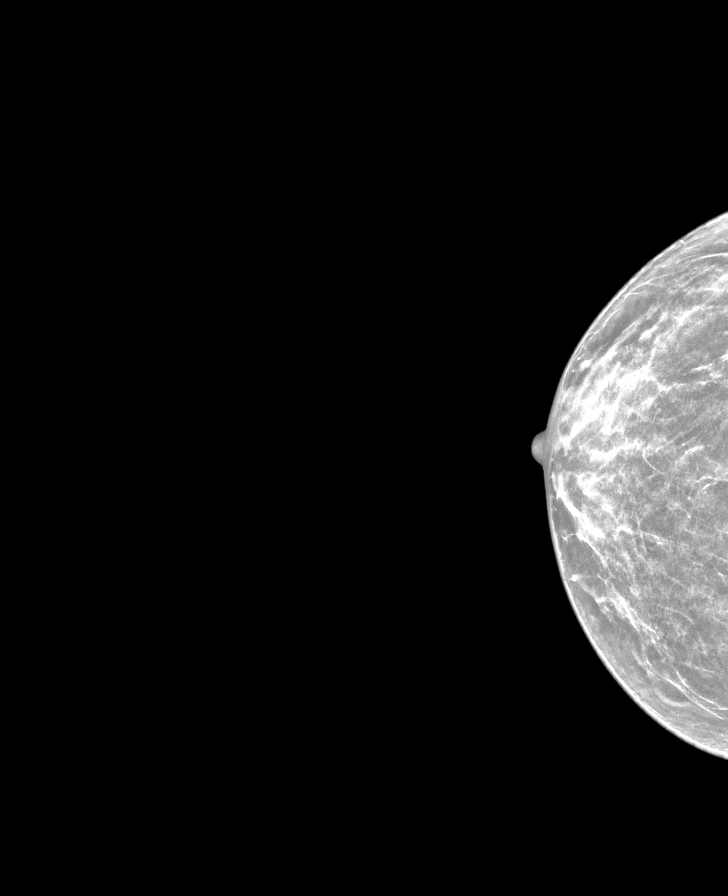

[L MLO synth-2D]
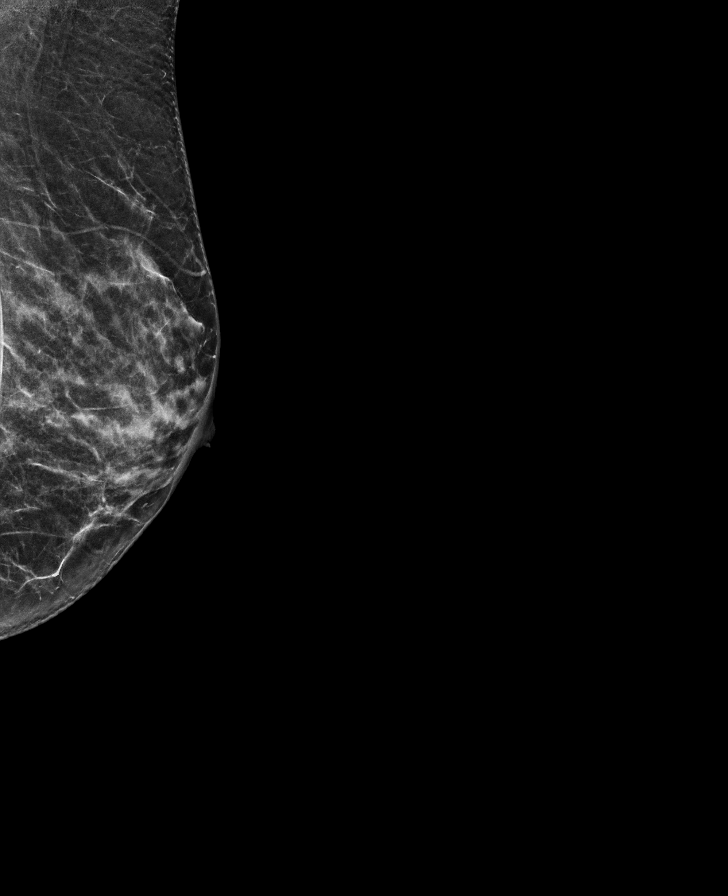

[R MLO synth-2D]
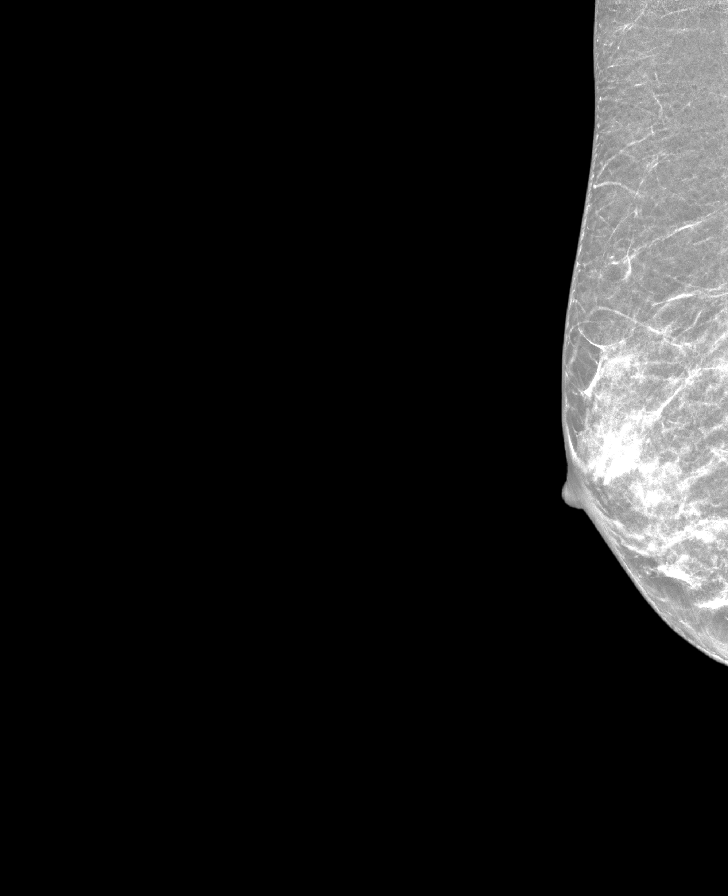

[L CC synth-2D]
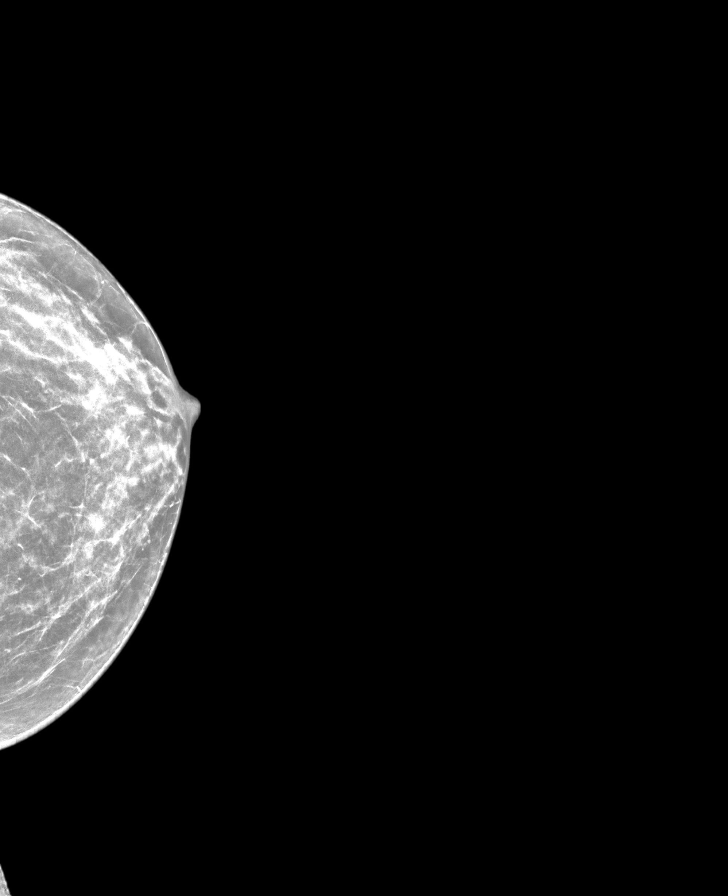

[8 of 28 positions shown; findings below may reference images not displayed]

ACR Breast Density Category c: The breast tissue is heterogeneously
dense, which may obscure small masses.
FINDINGS: There are no findings suspicious for malignancy. Images were
processed with CAD.
IMPRESSION: No mammographic evidence of malignancy. A result letter of this
screening mammogram will be mailed directly to the patient.

RECOMMENDATION:
Screening mammogram in one year. (Code:49-X-OQ9)

BI-RADS CATEGORY  1:  Negative.

## 2020-08-06 ENCOUNTER — Telehealth: Payer: Self-pay | Admitting: Neurology

## 2020-08-06 NOTE — Telephone Encounter (Signed)
Prior Authorization for Icosapent Ethyl 1GM capsules submitted via covermymeds. Awaiting response.

## 2020-08-26 DIAGNOSIS — I8312 Varicose veins of left lower extremity with inflammation: Secondary | ICD-10-CM | POA: Diagnosis not present

## 2020-09-08 ENCOUNTER — Encounter: Payer: Self-pay | Admitting: Osteopathic Medicine

## 2020-09-09 MED ORDER — HYDROCHLOROTHIAZIDE 12.5 MG PO TABS: 25.0000 mg | ORAL_TABLET | Freq: Every day | ORAL | 3 refills | Status: DC

## 2020-09-11 MED ORDER — HYDROCHLOROTHIAZIDE 12.5 MG PO TABS: 25.0000 mg | ORAL_TABLET | Freq: Every day | ORAL | 3 refills | Status: DC

## 2020-09-15 DIAGNOSIS — I8312 Varicose veins of left lower extremity with inflammation: Secondary | ICD-10-CM | POA: Diagnosis not present

## 2020-09-30 NOTE — Telephone Encounter (Signed)
Deniedon May 31 PA Case: [id number], Status: Denied. Notification: Completed.

## 2020-10-14 DIAGNOSIS — I8312 Varicose veins of left lower extremity with inflammation: Secondary | ICD-10-CM | POA: Diagnosis not present

## 2020-10-23 ENCOUNTER — Ambulatory Visit: Payer: BC Managed Care – PPO | Admitting: Osteopathic Medicine

## 2020-11-10 LAB — COMPLETE METABOLIC PANEL WITH GFR
AG Ratio: 1.9 (calc) (ref 1.0–2.5)
ALT: 25 U/L (ref 6–29)
AST: 20 U/L (ref 10–35)
Albumin: 4.5 g/dL (ref 3.6–5.1)
Alkaline phosphatase (APISO): 65 U/L (ref 37–153)
BUN: 25 mg/dL (ref 7–25)
CO2: 27 mmol/L (ref 20–32)
Calcium: 9.7 mg/dL (ref 8.6–10.4)
Chloride: 101 mmol/L (ref 98–110)
Creat: 0.98 mg/dL (ref 0.50–1.03)
Globulin: 2.4 g/dL (calc) (ref 1.9–3.7)
Glucose, Bld: 101 mg/dL — ABNORMAL HIGH (ref 65–99)
Potassium: 4.5 mmol/L (ref 3.5–5.3)
Sodium: 137 mmol/L (ref 135–146)
Total Bilirubin: 0.6 mg/dL (ref 0.2–1.2)
Total Protein: 6.9 g/dL (ref 6.1–8.1)
eGFR: 67 mL/min/{1.73_m2} (ref >=60)

## 2020-11-10 LAB — LIPID PANEL W/REFLEX DIRECT LDL
Cholesterol: 158 mg/dL (ref <200)
HDL: 43 mg/dL — ABNORMAL LOW (ref >=50)
LDL Cholesterol (Calc): 91 mg/dL (calc)
Non-HDL Cholesterol (Calc): 115 mg/dL (calc) (ref <130)
Total CHOL/HDL Ratio: 3.7 (calc) (ref <5.0)
Triglycerides: 140 mg/dL (ref <150)

## 2020-11-10 LAB — TSH: TSH: 2.1 mIU/L (ref 0.40–4.50)

## 2020-11-12 ENCOUNTER — Encounter: Payer: Self-pay | Admitting: Osteopathic Medicine

## 2020-11-12 ENCOUNTER — Other Ambulatory Visit: Payer: Self-pay

## 2020-11-12 ENCOUNTER — Ambulatory Visit (INDEPENDENT_AMBULATORY_CARE_PROVIDER_SITE_OTHER): Payer: BC Managed Care – PPO | Admitting: Osteopathic Medicine

## 2020-11-12 VITALS — BP 139/76 | HR 86 | Temp 97.9°F

## 2020-11-12 DIAGNOSIS — Z0000 Encounter for general adult medical examination without abnormal findings: Secondary | ICD-10-CM | POA: Diagnosis not present

## 2020-11-12 DIAGNOSIS — R7301 Impaired fasting glucose: Secondary | ICD-10-CM

## 2020-11-12 DIAGNOSIS — E781 Pure hyperglyceridemia: Secondary | ICD-10-CM

## 2020-11-12 DIAGNOSIS — I10 Essential (primary) hypertension: Secondary | ICD-10-CM

## 2020-11-12 DIAGNOSIS — E782 Mixed hyperlipidemia: Secondary | ICD-10-CM | POA: Diagnosis not present

## 2020-11-12 LAB — POCT GLYCOSYLATED HEMOGLOBIN (HGB A1C): Hemoglobin A1C: 5.7 % — AB (ref 4.0–5.6)

## 2020-11-12 MED ORDER — VALSARTAN-HYDROCHLOROTHIAZIDE 160-12.5 MG PO TABS: 1.0000 | ORAL_TABLET | Freq: Every day | ORAL | 0 refills | Status: DC

## 2020-11-12 NOTE — Patient Instructions (Signed)
General Preventive Care Most recent routine screening labs: see attached.  Blood pressure goal 130/80 or less.  Tobacco: don't!  Alcohol: responsible moderation is ok for most adults - if you have concerns about your alcohol intake, please talk to me!  Exercise: as tolerated to reduce risk of cardiovascular disease and diabetes. Strength training will also prevent osteoporosis.  Mental health: if need for mental health care (medicines, counseling, other), or concerns about moods, please let me know!  Sexual / Reproductive health: if need for STD testing, or if concerns with libido/pain problems, please let me know!  Advanced Directive: Living Will and/or Healthcare Power of Attorney recommended for all adults, regardless of age or health.  Vaccines Flu vaccine: for almost everyone, every fall.  Shingles vaccine: all done! Pneumonia vaccines: after age 1, or sooner if certain medical conditions. Tetanus booster: every 10 years, due 2025 COVID vaccine: THANKS for getting your vaccine! :)  Cancer screenings  Colon cancer screening: Colonoscopy 05/2024 Breast cancer screening: mammogram every other year at least, recommended annually after age 29.  Cervical cancer screening: Pap due 2025 Lung cancer screening: not needed for non-smokers  Infection screenings  HIV: recommended screening at least once age 36-65, more often as needed. Gonorrhea/Chlamydia: screening as needed Hepatitis C: recommended once for everyone age 25-75 TB: certain at-risk populations Other Bone Density Test: recommended for women at age 33

## 2020-11-12 NOTE — Progress Notes (Signed)
Lorraine Patton is a 55 y.o. female who presents to  [place] at [facility]  today, 11/12/20, seeking care for the following:  Annual physical      ASSESSMENT & PLAN with other pertinent findings:  The primary encounter diagnosis was Annual physical exam. Diagnoses of Elevated fasting glucose, Mixed hyperlipidemia, Familial hypertriglyceridemia - better w/ diet/exercise , and White coat syndrome with hypertension were also pertinent to this visit.   1. Annual physical exam See below for review preventive care   2. Elevated fasting glucose A1C today in prediabetic range, first measurement. Advised lower carb diet and increase exercise, will recheck in 6 mos   3. Mixed hyperlipidemia 4. Familial hypertriglyceridemia - better w/ diet/exercise  See labs - stable  5. White coat syndrome with hypertension BP Readings from Last 3 Encounters:  11/12/20 139/76  07/23/20 (!) 145/82  06/22/20 (!) 158/89  NOt bad today! Pt chekcing at home, goals <130/80 discussed    Patient Instructions  General Preventive Care Most recent routine screening labs: see attached.  Blood pressure goal 130/80 or less.  Tobacco: don't!  Alcohol: responsible moderation is ok for most adults - if you have concerns about your alcohol intake, please talk to me!  Exercise: as tolerated to reduce risk of cardiovascular disease and diabetes. Strength training will also prevent osteoporosis.  Mental health: if need for mental health care (medicines, counseling, other), or concerns about moods, please let me know!  Sexual / Reproductive health: if need for STD testing, or if concerns with libido/pain problems, please let me know!  Advanced Directive: Living Will and/or Healthcare Power of Attorney recommended for all adults, regardless of age or health.  Vaccines Flu vaccine: for almost everyone, every fall.  Shingles vaccine: all done! Pneumonia vaccines: after age 45, or  sooner if certain medical conditions. Tetanus booster: every 10 years, due 2025 COVID vaccine: THANKS for getting your vaccine! :)  Cancer screenings  Colon cancer screening: Colonoscopy 05/2024 Breast cancer screening: mammogram every other year at least, recommended annually after age 63.  Cervical cancer screening: Pap due 2025 Lung cancer screening: not needed for non-smokers  Infection screenings  HIV: recommended screening at least once age 40-65, more often as needed. Gonorrhea/Chlamydia: screening as needed Hepatitis C: recommended once for everyone age 86-76 TB: certain at-risk populations Other Bone Density Test: recommended for women at age 69  Orders Placed This Encounter  Procedures   POCT HgB A1C    Meds ordered this encounter  Medications   valsartan-hydrochlorothiazide (DIOVAN-HCT) 160-12.5 MG tablet    Sig: Take 1 tablet by mouth daily.    Dispense:  90 tablet    Refill:  0      See below for relevant physical exam findings  See below for recent lab and imaging results reviewed  Medications, allergies, PMH, PSH, SocH, FamH reviewed below    Follow-up instructions: Return in about 6 months (around 05/12/2021) for MONITOR LABS (LIPIDS, CMP, CBC, A1C) W/ JOY .                                        Exam:  BP 139/76 (BP Location: Left Arm, Patient Position: Sitting, Cuff Size: Large)   Pulse 86   Temp 97.9 F (36.6 C) (Oral)   LMP 01/16/2018 (Approximate)  Constitutional: VS see above. General Appearance: alert, well-developed, well-nourished, NAD Neck: No  masses, trachea midline.  Respiratory: Normal respiratory effort. no wheeze, no rhonchi, no rales Cardiovascular: S1/S2 normal, no murmur, no rub/gallop auscultated. RRR.  Musculoskeletal: Gait normal. Symmetric and independent movement of all extremities Abdominal: non-tender, non-distended, no appreciable organomegaly, neg Murphy's, BS WNLx4 Neurological: Normal  balance/coordination. No tremor. Skin: warm, dry, intact.  Psychiatric: Normal judgment/insight. Normal mood and affect. Oriented x3.   Current Meds  Medication Sig   atorvastatin (LIPITOR) 20 MG tablet Take 1 tablet (20 mg total) by mouth daily.   calcium carbonate (OS-CAL - DOSED IN MG OF ELEMENTAL CALCIUM) 1250 (500 Ca) MG tablet Take 1 tablet by mouth.   magnesium 30 MG tablet Take 30 mg by mouth 2 (two) times daily.   Melatonin 5 MG TABS Take 1 tablet by mouth daily.   Multiple Vitamins-Minerals (MULTIPLE VITAMINS/WOMENS PO) Take 1 tablet by mouth daily.   Omega-3 Fatty Acids (FISH OIL PO) Take 1 capsule by mouth daily.   Turmeric 500 MG TABS Take 1 tablet by mouth daily.   valsartan-hydrochlorothiazide (DIOVAN-HCT) 160-12.5 MG tablet Take 1 tablet by mouth daily.   [DISCONTINUED] hydrochlorothiazide (HYDRODIURIL) 12.5 MG tablet Take 2 tablets (25 mg total) by mouth daily.    No Known Allergies  Patient Active Problem List   Diagnosis Date Noted   Annual physical exam 12/02/2015   White coat syndrome without diagnosis of hypertension 05/25/2015   Cardiac risk counseling 05/25/2015   Hyperlipidemia, unspecified 02/15/2014    Family History  Problem Relation Age of Onset   Myelodysplastic syndrome Sister    Cancer Sister    HIV/AIDS Brother    Diabetes Maternal Grandmother    Heart disease Maternal Grandfather    Hyperlipidemia Maternal Uncle     Social History   Tobacco Use  Smoking Status Never  Smokeless Tobacco Never    Past Surgical History:  Procedure Laterality Date   AUGMENTATION MAMMAPLASTY Bilateral    BREAST ENHANCEMENT SURGERY  2016 and [place]    Immunization History  Administered Date(s) Administered   Influenza Inj Mdck Quad Pf 12/03/2018   Influenza Split 12/19/2014   Influenza, Seasonal, Injecte, Preservative Fre 12/24/2013   Influenza,inj,Quad PF,6+ Mos 12/02/2015, 12/14/2016, 12/22/2017   Influenza-Unspecified 12/30/2010,  12/20/2011, 12/19/2014, 01/06/2020   Janssen (J&J) SARS-COV-2 Vaccination 06/24/2019   PFIZER(Purple Top)SARS-COV-2 Vaccination 06/24/2019, 02/05/2020, 07/08/2020   Tdap 03/18/2013   Zoster Recombinat (Shingrix) 01/06/2020, 04/16/2020    Recent Results (from the past 2160 hour(s))  Lipid Panel w/reflex Direct LDL     Status: Abnormal   Collection Time: 11/09/20  9:44 AM  Result Value Ref Range   Cholesterol 158 <200 mg/dL   HDL 43 (L) > OR = 50 mg/dL   Triglycerides 140 <150 mg/dL   LDL Cholesterol (Calc) 91 mg/dL (calc)    Comment: Reference range: <100 . Desirable range <100 mg/dL for primary prevention;   <70 mg/dL for patients with CHD or diabetic patients  with > or = 2 CHD risk factors. Marland Kitchen LDL-C is now calculated using the Martin-Hopkins  calculation, which is a validated novel method providing  better accuracy than the Friedewald equation in the  estimation of LDL-C.  Cresenciano Genre et al. Annamaria Helling. 4163;845(36): 2061-2068  (http://education.[facility].com/faq/FAQ164)    Total CHOL/HDL Ratio 3.7 <5.0 (calc)   Non-HDL Cholesterol (Calc) 115 <130 mg/dL (calc)    Comment: For patients with diabetes plus 1 major ASCVD risk  factor, treating to a non-HDL-C goal of <100 mg/dL  (LDL-C of <70 mg/dL) is  considered a therapeutic  option.   COMPLETE METABOLIC PANEL WITH GFR     Status: Abnormal   Collection Time: 11/09/20  9:44 AM  Result Value Ref Range   Glucose, Bld 101 (H) 65 - 99 mg/dL    Comment: .            Fasting reference interval . For someone without known diabetes, a glucose value between 100 and 125 mg/dL is consistent with prediabetes and should be confirmed with a follow-up test. .    BUN 25 7 - 25 mg/dL   Creat 0.98 0.50 - 1.03 mg/dL   eGFR 67 > OR = 60 mL/min/1.89m    Comment: The eGFR is based on the CKD-EPI 2021 equation. To calculate  the new eGFR from a previous Creatinine or Cystatin C result, go to  https://www.kidney.org/professionals/ kdoqi/gfr%5Fcalculator    BUN/Creatinine Ratio NOT APPLICABLE 6 - 22 (calc)   Sodium 137 135 - 146 mmol/L   Potassium 4.5 3.5 - 5.3 mmol/L   Chloride 101 98 - 110 mmol/L   CO2 27 20 - 32 mmol/L   Calcium 9.7 8.6 - 10.4 mg/dL   Total Protein 6.9 6.1 - 8.1 g/dL   Albumin 4.5 3.6 - 5.1 g/dL   Globulin 2.4 1.9 - 3.7 g/dL (calc)   AG Ratio 1.9 1.0 - 2.5 (calc)   Total Bilirubin 0.6 0.2 - 1.2 mg/dL   Alkaline phosphatase (APISO) 65 37 - 153 U/L   AST 20 10 - 35 U/L   ALT 25 6 - 29 U/L  TSH     Status: None   Collection Time: 11/09/20  9:44 AM  Result Value Ref Range   TSH 2.10 0.40 - 4.50 mIU/L  POCT HgB A1C     Status: Abnormal   Collection Time: 11/12/20 11:20 AM  Result Value Ref Range   Hemoglobin A1C 5.7 (A) 4.0 - 5.6 %   HbA1c POC (<> result, manual entry)     HbA1c, POC (prediabetic range)     HbA1c, POC (controlled diabetic range)      No results found.     All questions at time of visit were answered - patient instructed to contact office with any additional concerns or updates. ER/RTC precautions were reviewed with the patient as applicable.   Please note: manual typing as well as voice recognition software may have been used to produce this document - typos may escape review. Please contact Dr. ASheppard Coilfor any needed clarifications.

## 2021-01-13 DIAGNOSIS — M7981 Nontraumatic hematoma of soft tissue: Secondary | ICD-10-CM | POA: Diagnosis not present

## 2021-02-03 ENCOUNTER — Other Ambulatory Visit: Payer: Self-pay | Admitting: *Deleted

## 2021-02-08 ENCOUNTER — Other Ambulatory Visit: Payer: Self-pay

## 2021-02-08 DIAGNOSIS — I10 Essential (primary) hypertension: Secondary | ICD-10-CM

## 2021-02-08 MED ORDER — VALSARTAN-HYDROCHLOROTHIAZIDE 160-12.5 MG PO TABS: 1.0000 | ORAL_TABLET | Freq: Every day | ORAL | 0 refills | Status: DC

## 2021-04-14 ENCOUNTER — Encounter: Payer: Self-pay | Admitting: Obstetrics & Gynecology

## 2021-05-04 ENCOUNTER — Other Ambulatory Visit: Payer: Self-pay | Admitting: Medical-Surgical

## 2021-05-04 DIAGNOSIS — I10 Essential (primary) hypertension: Secondary | ICD-10-CM

## 2021-05-05 ENCOUNTER — Other Ambulatory Visit: Payer: Self-pay | Admitting: *Deleted

## 2021-05-05 DIAGNOSIS — Z87898 Personal history of other specified conditions: Secondary | ICD-10-CM

## 2021-05-05 NOTE — Progress Notes (Signed)
Order placed for Bilateral breast U/S per VO Dr Penne Lash

## 2021-05-10 ENCOUNTER — Other Ambulatory Visit: Payer: Self-pay | Admitting: Medical-Surgical

## 2021-05-10 DIAGNOSIS — Z1231 Encounter for screening mammogram for malignant neoplasm of breast: Secondary | ICD-10-CM

## 2021-05-28 ENCOUNTER — Other Ambulatory Visit: Payer: Self-pay | Admitting: Medical-Surgical

## 2021-05-28 DIAGNOSIS — I10 Essential (primary) hypertension: Secondary | ICD-10-CM

## 2021-06-07 ENCOUNTER — Encounter: Payer: Self-pay | Admitting: Medical-Surgical

## 2021-06-07 DIAGNOSIS — I10 Essential (primary) hypertension: Secondary | ICD-10-CM

## 2021-06-07 MED ORDER — VALSARTAN-HYDROCHLOROTHIAZIDE 160-12.5 MG PO TABS: 1.0000 | ORAL_TABLET | Freq: Every day | ORAL | 0 refills | Status: DC

## 2021-06-24 ENCOUNTER — Encounter: Payer: Self-pay | Admitting: Medical-Surgical

## 2021-06-24 DIAGNOSIS — I10 Essential (primary) hypertension: Secondary | ICD-10-CM

## 2021-06-24 DIAGNOSIS — E782 Mixed hyperlipidemia: Secondary | ICD-10-CM

## 2021-06-24 DIAGNOSIS — R7309 Other abnormal glucose: Secondary | ICD-10-CM

## 2021-06-28 DIAGNOSIS — E039 Hypothyroidism, unspecified: Secondary | ICD-10-CM | POA: Diagnosis not present

## 2021-06-28 DIAGNOSIS — N951 Menopausal and female climacteric states: Secondary | ICD-10-CM | POA: Diagnosis not present

## 2021-06-29 ENCOUNTER — Ambulatory Visit: Payer: BC Managed Care – PPO | Admitting: Medical-Surgical

## 2021-06-30 ENCOUNTER — Ambulatory Visit: Payer: BC Managed Care – PPO | Admitting: Medical-Surgical

## 2021-06-30 DIAGNOSIS — R7301 Impaired fasting glucose: Secondary | ICD-10-CM | POA: Insufficient documentation

## 2021-06-30 NOTE — Progress Notes (Signed)
? ?Established Patient Office Visit ? ?Subjective   ?Patient ID: Lorraine Patton, female    DOB: 09-Dec-1965  Age: 56 y.o. MRN: [id number] ? ?Chief Complaint  ?Patient presents with  ? Transitions Of Care  ? Hypertension  ? ? ?Hypertension ?This is a chronic problem. The current episode started more than 1 year ago. The problem is uncontrolled. Associated symptoms include anxiety. Pertinent negatives include no blurred vision, chest pain, headaches, palpitations, peripheral edema or shortness of breath. There are no associated agents to hypertension. Risk factors for coronary artery disease include dyslipidemia. Past treatments include angiotensin blockers and diuretics. The current treatment provides moderate improvement. There are no compliance problems.  There is no history of chronic renal disease.  ?Hyperlipidemia ?This is a chronic problem. The current episode started more than 1 year ago. The problem is uncontrolled. Recent lipid tests were reviewed and are high. Exacerbating diseases include obesity. She has no history of chronic renal disease, diabetes, hypothyroidism, liver disease or nephrotic syndrome. There are no known factors aggravating her hyperlipidemia. Pertinent negatives include no chest pain or shortness of breath. Current antihyperlipidemic treatment includes statins. The current treatment provides moderate improvement of lipids. There are no compliance problems.  Risk factors for coronary artery disease include hypertension, dyslipidemia and obesity.  ?Menopausal symptoms/weight gain ?Has been having hot flashes, night sweats, and weight gain that she relates to being in menopause. She exercises regularly and has tried multiple dietary changes without benefit. She is interested in weight loss medication options. Is open to options to help with menopausal symptoms as well.  ? ? ? ?Review of Systems  ?Constitutional:  Positive for diaphoresis (night sweats.). Negative for chills, fever and weight  loss.  ?Eyes:  Negative for blurred vision.  ?Respiratory:  Negative for cough, shortness of breath and wheezing.   ?Cardiovascular:  Negative for chest pain, palpitations and leg swelling.  ?Neurological:  Negative for dizziness and headaches.  ?Psychiatric/Behavioral:  Positive for depression. Negative for suicidal ideas. The patient is nervous/anxious and has insomnia.   ? ?  ?Objective:  ?  ? ?BP (!) 156/87 (BP Location: Left Arm, Cuff Size: Normal)   Pulse 95   Resp 20   Ht 5\' 7"  (1.702 m)   Wt 215 lb 1.9 oz (97.6 kg)   LMP 01/16/2018 (Approximate)   SpO2 97%   BMI 33.69 kg/m?  ? ? ?Physical Exam ?Vitals reviewed.  ?Constitutional:   ?   General: She is not in acute distress. ?   Appearance: Normal appearance. She is obese. She is not ill-appearing.  ?HENT:  ?   Head: Normocephalic and atraumatic.  ?Cardiovascular:  ?   Rate and Rhythm: Normal rate and regular rhythm.  ?   Pulses: Normal pulses.  ?   Heart sounds: Normal heart sounds. No murmur heard. ?  No friction rub. No gallop.  ?Pulmonary:  ?   Effort: Pulmonary effort is normal. No respiratory distress.  ?   Breath sounds: Normal breath sounds. No wheezing.  ?Skin: ?   General: Skin is warm and dry.  ?Neurological:  ?   Mental Status: She is alert and oriented to person, place, and time.  ?Psychiatric:     ?   Mood and Affect: Mood normal.     ?   Behavior: Behavior normal.     ?   Thought Content: Thought content normal.     ?   Judgment: Judgment normal.  ? ?No results found for any visits on 07/01/21. ? ? ? ?  The 10-year ASCVD risk score (Arnett DK, et al., 2019) is: 4.8% ? ?  ?Assessment & Plan:  ? ?Problem List Items Addressed This Visit   ? ?  ? Cardiovascular and Mediastinum  ? Essential hypertension - Primary  ?  Blood pressure not at goal today. Home readings consistently elevated although she did see a SBP in the 120s this morning. Increasing Valsartan-HCTZ to 160-25mg  daily. Monitor BP at home with a goal of 130/80 or less. Low sodium  diet. Continue regular exercise.  ? ?  ?  ? Relevant Medications  ? valsartan-hydrochlorothiazide (DIOVAN-HCT) 160-25 MG tablet  ? Other Relevant Orders  ? CBC with Differential/Platelet  ? COMPLETE METABOLIC PANEL WITH GFR  ? Lipid panel  ?  ? Other  ? Hyperlipidemia, unspecified  ?  Checking lipid panel. Continue Atorvastatin 20mg  daily.  ? ?  ?  ? Relevant Medications  ? valsartan-hydrochlorothiazide (DIOVAN-HCT) 160-25 MG tablet  ? Other Relevant Orders  ? Lipid panel  ? Elevated fasting glucose  ?  Checking A1c.  ? ?  ?  ? Relevant Orders  ? Hemoglobin A1c  ? Thyroid disorder screen  ?  Checking TSH. ? ?  ?  ? Relevant Orders  ? TSH  ? Weight gain  ?  Discussed various options. She would like to see if her insurance will cover Semaglutide. In the meantime, she will look into off label options including Metformin, Wellbutrin, and Topamax.  ? ?  ?  ? Menopausal symptoms  ?  Reviewed various options for management of menopausal symptoms. Would like for her OB/GYN to manage any hormonal options if she chooses that route. Other options including Effexor, Pristiq, and Cymbalta were reviewed. She will do some independent research and let me know if there is one that she would like to try.  ? ?  ?  ? ?Other Visit Diagnoses   ? ? Encounter to establish care      ? ?  ? ? ?Return in about 6 weeks (around 08/12/2021) for weight/mood follow up if starting a medication .  ?__________________________________________ ?Clearnce Sorrel, DNP, APRN, FNP-BC ?Primary Care and Sports Medicine ?[place] ? ? ?

## 2021-07-01 ENCOUNTER — Ambulatory Visit (INDEPENDENT_AMBULATORY_CARE_PROVIDER_SITE_OTHER): Payer: BC Managed Care – PPO | Admitting: Medical-Surgical

## 2021-07-01 ENCOUNTER — Other Ambulatory Visit: Payer: Self-pay | Admitting: Medical-Surgical

## 2021-07-01 ENCOUNTER — Encounter: Payer: Self-pay | Admitting: Medical-Surgical

## 2021-07-01 ENCOUNTER — Telehealth: Payer: Self-pay

## 2021-07-01 ENCOUNTER — Ambulatory Visit
Admission: RE | Admit: 2021-07-01 | Discharge: 2021-07-01 | Disposition: A | Discharge status: Home / Self Care | Payer: BC Managed Care – PPO | Source: Ambulatory Visit | Attending: Medical-Surgical | Admitting: Medical-Surgical

## 2021-07-01 VITALS — BP 156/87 | HR 95 | Resp 20 | Ht 67.0 in | Wt 215.1 lb

## 2021-07-01 DIAGNOSIS — N951 Menopausal and female climacteric states: Secondary | ICD-10-CM | POA: Insufficient documentation

## 2021-07-01 DIAGNOSIS — I10 Essential (primary) hypertension: Secondary | ICD-10-CM

## 2021-07-01 DIAGNOSIS — E782 Mixed hyperlipidemia: Secondary | ICD-10-CM | POA: Diagnosis not present

## 2021-07-01 DIAGNOSIS — R7301 Impaired fasting glucose: Secondary | ICD-10-CM | POA: Diagnosis not present

## 2021-07-01 DIAGNOSIS — Z1231 Encounter for screening mammogram for malignant neoplasm of breast: Secondary | ICD-10-CM | POA: Diagnosis not present

## 2021-07-01 DIAGNOSIS — Z1329 Encounter for screening for other suspected endocrine disorder: Secondary | ICD-10-CM | POA: Insufficient documentation

## 2021-07-01 DIAGNOSIS — Z7689 Persons encountering health services in other specified circumstances: Secondary | ICD-10-CM

## 2021-07-01 DIAGNOSIS — R635 Abnormal weight gain: Secondary | ICD-10-CM | POA: Insufficient documentation

## 2021-07-01 HISTORY — DX: Menopausal and female climacteric states: N95.1

## 2021-07-01 MED ORDER — SEMAGLUTIDE(0.25 OR 0.5MG/DOS) 2 MG/3ML ~~LOC~~ SOPN: 0.2500 mg | PEN_INJECTOR | SUBCUTANEOUS | 0 refills | Status: DC

## 2021-07-01 MED ORDER — VALSARTAN-HYDROCHLOROTHIAZIDE 160-25 MG PO TABS: 1.0000 | ORAL_TABLET | Freq: Every day | ORAL | 3 refills | Status: DC

## 2021-07-01 NOTE — Assessment & Plan Note (Signed)
Reviewed various options for management of menopausal symptoms. Would like for her OB/GYN to manage any hormonal options if she chooses that route. Other options including Effexor, Pristiq, and Cymbalta were reviewed. She will do some independent research and let me know if there is one that she would like to try.  ?

## 2021-07-01 NOTE — Telephone Encounter (Signed)
Initiated Prior authorization ION:[id number] (0.25 or 0.5 MG/DOSE) 2MG /3ML pen-injectors ?Via: Covermymeds ?Case/Key:[id number] ?Status: approved as of 07/01/21 ?Reason:Coverage Starts on: 07/01/2021 12:00:00 AM, Coverage Ends on: 07/01/2022 12:00:00 AM. ?Notified Pt via: Mychart ?

## 2021-07-01 NOTE — Assessment & Plan Note (Signed)
Checking TSH.

## 2021-07-01 NOTE — Assessment & Plan Note (Signed)
Discussed various options. She would like to see if her insurance will cover Semaglutide. In the meantime, she will look into off label options including Metformin, Wellbutrin, and Topamax.  ?

## 2021-07-01 NOTE — Assessment & Plan Note (Signed)
Blood pressure not at goal today. Home readings consistently elevated although she did see a SBP in the 120s this morning. Increasing Valsartan-HCTZ to 160-25mg  daily. Monitor BP at home with a goal of 130/80 or less. Low sodium diet. Continue regular exercise.  ?

## 2021-07-01 NOTE — Assessment & Plan Note (Signed)
Checking A1c.

## 2021-07-01 NOTE — Assessment & Plan Note (Signed)
Checking lipid panel. Continue Atorvastatin 20mg  daily.  ?

## 2021-07-02 ENCOUNTER — Ambulatory Visit: Payer: BC Managed Care – PPO | Admitting: Medical-Surgical

## 2021-07-02 DIAGNOSIS — E782 Mixed hyperlipidemia: Secondary | ICD-10-CM | POA: Diagnosis not present

## 2021-07-02 DIAGNOSIS — I10 Essential (primary) hypertension: Secondary | ICD-10-CM | POA: Diagnosis not present

## 2021-07-02 DIAGNOSIS — R7301 Impaired fasting glucose: Secondary | ICD-10-CM | POA: Diagnosis not present

## 2021-07-02 DIAGNOSIS — Z1329 Encounter for screening for other suspected endocrine disorder: Secondary | ICD-10-CM | POA: Diagnosis not present

## 2021-07-03 LAB — CBC WITH DIFFERENTIAL/PLATELET
Absolute Monocytes: 392 cells/uL (ref 200–950)
Basophils Absolute: 40 cells/uL (ref 0–200)
Basophils Relative: 0.9 %
Eosinophils Absolute: 119 cells/uL (ref 15–500)
Eosinophils Relative: 2.7 %
HCT: 41.4 % (ref 35.0–45.0)
Hemoglobin: 13.8 g/dL (ref 11.7–15.5)
Lymphs Abs: 1575 cells/uL (ref 850–3900)
MCH: 30.7 pg (ref 27.0–33.0)
MCHC: 33.3 g/dL (ref 32.0–36.0)
MCV: 92 fL (ref 80.0–100.0)
MPV: 9.9 fL (ref 7.5–12.5)
Monocytes Relative: 8.9 %
Neutro Abs: 2275 cells/uL (ref 1500–7800)
Neutrophils Relative %: 51.7 %
Platelets: 282 10*3/uL (ref 140–400)
RBC: 4.5 10*6/uL (ref 3.80–5.10)
RDW: 12.7 % (ref 11.0–15.0)
Total Lymphocyte: 35.8 %
WBC: 4.4 10*3/uL (ref 3.8–10.8)

## 2021-07-03 LAB — HEMOGLOBIN A1C
Hgb A1c MFr Bld: 5.5 % of total Hgb (ref <5.7)
Mean Plasma Glucose: 111 mg/dL
eAG (mmol/L): 6.2 mmol/L

## 2021-07-03 LAB — COMPLETE METABOLIC PANEL WITH GFR
AG Ratio: 1.8 (calc) (ref 1.0–2.5)
ALT: 32 U/L — ABNORMAL HIGH (ref 6–29)
AST: 22 U/L (ref 10–35)
Albumin: 4.4 g/dL (ref 3.6–5.1)
Alkaline phosphatase (APISO): 70 U/L (ref 37–153)
BUN: 21 mg/dL (ref 7–25)
CO2: 29 mmol/L (ref 20–32)
Calcium: 9.1 mg/dL (ref 8.6–10.4)
Chloride: 108 mmol/L (ref 98–110)
Creat: 1 mg/dL (ref 0.50–1.03)
Globulin: 2.4 g/dL (calc) (ref 1.9–3.7)
Glucose, Bld: 97 mg/dL (ref 65–99)
Potassium: 4.7 mmol/L (ref 3.5–5.3)
Sodium: 141 mmol/L (ref 135–146)
Total Bilirubin: 0.6 mg/dL (ref 0.2–1.2)
Total Protein: 6.8 g/dL (ref 6.1–8.1)
eGFR: 66 mL/min/{1.73_m2} (ref >=60)

## 2021-07-03 LAB — TSH: TSH: 1.58 mIU/L (ref 0.40–4.50)

## 2021-07-03 LAB — LIPID PANEL
Cholesterol: 142 mg/dL (ref <200)
HDL: 39 mg/dL — ABNORMAL LOW (ref >=50)
LDL Cholesterol (Calc): 79 mg/dL (calc)
Non-HDL Cholesterol (Calc): 103 mg/dL (calc) (ref <130)
Total CHOL/HDL Ratio: 3.6 (calc) (ref <5.0)
Triglycerides: 144 mg/dL (ref <150)

## 2021-07-08 DIAGNOSIS — Z1231 Encounter for screening mammogram for malignant neoplasm of breast: Secondary | ICD-10-CM

## 2021-07-31 ENCOUNTER — Other Ambulatory Visit: Payer: Self-pay | Admitting: Osteopathic Medicine

## 2021-08-03 ENCOUNTER — Encounter: Payer: Self-pay | Admitting: Medical-Surgical

## 2021-08-10 NOTE — Progress Notes (Unsigned)
   Established Patient Office Visit  Subjective   Patient ID: Lorraine Patton, female   DOB: 1966-01-29 Age: 56 y.o. MRN: [id number]   No chief complaint on file.   HPI Pleasant 56 year old female presenting today for the following:  Weight loss:  Mood/menopausal symptoms:  ROS    Objective:    There were no vitals filed for this visit.   Physical Exam    No results found for this or any previous visit (from the past 24 hour(s)).   {Labs (Optional):23779}  The 10-year ASCVD risk score (Arnett DK, et al., 2019) is: 4.7%   Values used to calculate the score:     Age: 66 years     Sex: Female     Is Non-Hispanic African American: No     Diabetic: No     Tobacco smoker: No     Systolic Blood Pressure: 156 mmHg     Is BP treated: Yes     HDL Cholesterol: 39 mg/dL     Total Cholesterol: 142 mg/dL   Assessment & Plan:   No problem-specific Assessment & Plan notes found for this encounter.   No follow-ups on file.  ___________________________________________ Thayer Ohm, DNP, APRN, FNP-BC Primary Care and Sports Medicine [facility] [place]

## 2021-08-11 ENCOUNTER — Ambulatory Visit (INDEPENDENT_AMBULATORY_CARE_PROVIDER_SITE_OTHER): Payer: BC Managed Care – PPO | Admitting: Medical-Surgical

## 2021-08-11 ENCOUNTER — Encounter: Payer: Self-pay | Admitting: Medical-Surgical

## 2021-08-11 VITALS — BP 123/78 | HR 89 | Resp 20 | Ht 67.0 in | Wt 209.5 lb

## 2021-08-11 DIAGNOSIS — R635 Abnormal weight gain: Secondary | ICD-10-CM

## 2021-08-11 DIAGNOSIS — N951 Menopausal and female climacteric states: Secondary | ICD-10-CM

## 2021-08-11 MED ORDER — SEMAGLUTIDE (1 MG/DOSE) 4 MG/3ML ~~LOC~~ SOPN: 1.0000 mg | PEN_INJECTOR | SUBCUTANEOUS | 2 refills | Status: DC

## 2021-08-11 MED ORDER — ATORVASTATIN CALCIUM 20 MG PO TABS: 20.0000 mg | ORAL_TABLET | Freq: Every day | ORAL | 1 refills | Status: DC

## 2021-08-11 MED ORDER — SEMAGLUTIDE(0.25 OR 0.5MG/DOS) 2 MG/3ML ~~LOC~~ SOPN: 0.5000 mg | PEN_INJECTOR | SUBCUTANEOUS | 0 refills | Status: DC

## 2021-08-11 NOTE — Assessment & Plan Note (Signed)
6 pound weight loss over the last 4 to 6 weeks.  Has done very well with Ozempic.  Increasing dose to 0.5 mg weekly for the next 4 weeks then increase to 1 mg weekly if tolerating well.  We will touch base in about 2 months to see how she is doing and if she needs to increase further.  Recommend continuing regular intentional exercise.  Discussed the importance of dietary modification while on this medication as once she comes off, she will need to be able to maintain her weight loss.

## 2021-08-11 NOTE — Assessment & Plan Note (Signed)
Has decided that her symptoms are manageable as long as she is not having issues with weight.  She would like to continue with lifestyle modifications.  If she decides she would like to try medication in the future, she will let me know.

## 2021-08-13 ENCOUNTER — Ambulatory Visit: Payer: BC Managed Care – PPO | Admitting: Medical-Surgical

## 2021-09-10 ENCOUNTER — Encounter: Payer: Self-pay | Admitting: Medical-Surgical

## 2021-09-10 ENCOUNTER — Other Ambulatory Visit: Payer: Self-pay | Admitting: Medical-Surgical

## 2021-09-11 ENCOUNTER — Other Ambulatory Visit: Payer: Self-pay | Admitting: Medical-Surgical

## 2021-10-11 NOTE — Progress Notes (Unsigned)
   Established Patient Office Visit  Subjective   Patient ID: Lorraine Patton, female   DOB: 1966-01-08 Age: 56 y.o. MRN: [id number]   No chief complaint on file.   HPI Pleasant 56 year old female presenting today for a follow up on weight loss efforts.    Objective:    There were no vitals filed for this visit.  Physical Exam   No results found for this or any previous visit (from the past 24 hour(s)).   {Labs (Optional):23779}  The 10-year ASCVD risk score (Arnett DK, et al., 2019) is: 3.2%   Values used to calculate the score:     Age: 40 years     Sex: Female     Is Non-Hispanic African American: No     Diabetic: No     Tobacco smoker: No     Systolic Blood Pressure: 123 mmHg     Is BP treated: Yes     HDL Cholesterol: 39 mg/dL     Total Cholesterol: 142 mg/dL   Assessment & Plan:   No problem-specific Assessment & Plan notes found for this encounter.   No follow-ups on file.  ___________________________________________ Thayer Ohm, DNP, APRN, FNP-BC Primary Care and Sports Medicine [facility] [place]

## 2021-10-12 ENCOUNTER — Ambulatory Visit (INDEPENDENT_AMBULATORY_CARE_PROVIDER_SITE_OTHER): Payer: BC Managed Care – PPO | Admitting: Medical-Surgical

## 2021-10-12 ENCOUNTER — Encounter: Payer: Self-pay | Admitting: Medical-Surgical

## 2021-10-12 VITALS — BP 125/77 | HR 71 | Resp 20 | Ht 67.0 in | Wt 200.7 lb

## 2021-10-12 DIAGNOSIS — R635 Abnormal weight gain: Secondary | ICD-10-CM

## 2021-10-12 DIAGNOSIS — I10 Essential (primary) hypertension: Secondary | ICD-10-CM | POA: Diagnosis not present

## 2021-10-12 MED ORDER — SEMAGLUTIDE (1 MG/DOSE) 4 MG/3ML ~~LOC~~ SOPN: 1.0000 mg | PEN_INJECTOR | SUBCUTANEOUS | 0 refills | Status: DC

## 2021-12-23 ENCOUNTER — Encounter: Payer: Self-pay | Admitting: Medical-Surgical

## 2021-12-23 DIAGNOSIS — E782 Mixed hyperlipidemia: Secondary | ICD-10-CM

## 2021-12-23 DIAGNOSIS — I10 Essential (primary) hypertension: Secondary | ICD-10-CM

## 2021-12-23 DIAGNOSIS — R928 Other abnormal and inconclusive findings on diagnostic imaging of breast: Secondary | ICD-10-CM

## 2021-12-23 DIAGNOSIS — N289 Disorder of kidney and ureter, unspecified: Secondary | ICD-10-CM

## 2022-01-05 DIAGNOSIS — E782 Mixed hyperlipidemia: Secondary | ICD-10-CM | POA: Diagnosis not present

## 2022-01-05 DIAGNOSIS — I10 Essential (primary) hypertension: Secondary | ICD-10-CM | POA: Diagnosis not present

## 2022-01-06 ENCOUNTER — Other Ambulatory Visit: Payer: Self-pay

## 2022-01-06 DIAGNOSIS — N289 Disorder of kidney and ureter, unspecified: Secondary | ICD-10-CM

## 2022-01-06 LAB — COMPLETE METABOLIC PANEL WITH GFR
AG Ratio: 1.8 (calc) (ref 1.0–2.5)
ALT: 17 U/L (ref 6–29)
AST: 19 U/L (ref 10–35)
Albumin: 4.3 g/dL (ref 3.6–5.1)
Alkaline phosphatase (APISO): 62 U/L (ref 37–153)
BUN/Creatinine Ratio: 21 (calc) (ref 6–22)
BUN: 23 mg/dL (ref 7–25)
CO2: 27 mmol/L (ref 20–32)
Calcium: 9.8 mg/dL (ref 8.6–10.4)
Chloride: 104 mmol/L (ref 98–110)
Creat: 1.12 mg/dL — ABNORMAL HIGH (ref 0.50–1.03)
Globulin: 2.4 g/dL (calc) (ref 1.9–3.7)
Glucose, Bld: 85 mg/dL (ref 65–99)
Potassium: 4.8 mmol/L (ref 3.5–5.3)
Sodium: 139 mmol/L (ref 135–146)
Total Bilirubin: 0.6 mg/dL (ref 0.2–1.2)
Total Protein: 6.7 g/dL (ref 6.1–8.1)
eGFR: 57 mL/min/{1.73_m2} — ABNORMAL LOW (ref >=60)

## 2022-01-06 LAB — CBC WITH DIFFERENTIAL/PLATELET
Absolute Monocytes: 446 cells/uL (ref 200–950)
Basophils Absolute: 29 cells/uL (ref 0–200)
Basophils Relative: 0.6 %
Eosinophils Absolute: 118 cells/uL (ref 15–500)
Eosinophils Relative: 2.4 %
HCT: 40.7 % (ref 35.0–45.0)
Hemoglobin: 13.7 g/dL (ref 11.7–15.5)
Lymphs Abs: 1774 cells/uL (ref 850–3900)
MCH: 30.9 pg (ref 27.0–33.0)
MCHC: 33.7 g/dL (ref 32.0–36.0)
MCV: 91.7 fL (ref 80.0–100.0)
MPV: 9.6 fL (ref 7.5–12.5)
Monocytes Relative: 9.1 %
Neutro Abs: 2533 cells/uL (ref 1500–7800)
Neutrophils Relative %: 51.7 %
Platelets: 310 10*3/uL (ref 140–400)
RBC: 4.44 10*6/uL (ref 3.80–5.10)
RDW: 12.3 % (ref 11.0–15.0)
Total Lymphocyte: 36.2 %
WBC: 4.9 10*3/uL (ref 3.8–10.8)

## 2022-01-06 LAB — LIPID PANEL
Cholesterol: 128 mg/dL (ref <200)
HDL: 44 mg/dL — ABNORMAL LOW (ref >=50)
LDL Cholesterol (Calc): 68 mg/dL (calc)
Non-HDL Cholesterol (Calc): 84 mg/dL (calc) (ref <130)
Total CHOL/HDL Ratio: 2.9 (calc) (ref <5.0)
Triglycerides: 75 mg/dL (ref <150)

## 2022-01-06 NOTE — Addendum Note (Signed)
Addended bySamuel Bouche on: 01/06/2022 12:52 PM   Modules accepted: Orders

## 2022-01-10 ENCOUNTER — Ambulatory Visit: Payer: BC Managed Care – PPO | Admitting: Medical-Surgical

## 2022-01-11 ENCOUNTER — Ambulatory Visit: Payer: BC Managed Care – PPO | Admitting: Medical-Surgical

## 2022-01-11 ENCOUNTER — Encounter: Payer: Self-pay | Admitting: Medical-Surgical

## 2022-01-11 ENCOUNTER — Ambulatory Visit (INDEPENDENT_AMBULATORY_CARE_PROVIDER_SITE_OTHER): Payer: BC Managed Care – PPO | Admitting: Medical-Surgical

## 2022-01-11 VITALS — BP 121/78 | HR 77 | Ht 67.0 in | Wt 186.0 lb

## 2022-01-11 DIAGNOSIS — Z7689 Persons encountering health services in other specified circumstances: Secondary | ICD-10-CM

## 2022-01-11 DIAGNOSIS — I10 Essential (primary) hypertension: Secondary | ICD-10-CM

## 2022-01-11 MED ORDER — SEMAGLUTIDE (1 MG/DOSE) 4 MG/3ML ~~LOC~~ SOPN: 1.0000 mg | PEN_INJECTOR | SUBCUTANEOUS | 1 refills | Status: DC

## 2022-01-11 MED ORDER — ATORVASTATIN CALCIUM 10 MG PO TABS: 10.0000 mg | ORAL_TABLET | Freq: Every day | ORAL | 1 refills | Status: DC

## 2022-01-11 MED ORDER — VALSARTAN-HYDROCHLOROTHIAZIDE 80-12.5 MG PO TABS: 1.0000 | ORAL_TABLET | Freq: Every day | ORAL | 3 refills | Status: DC

## 2022-01-11 NOTE — Progress Notes (Signed)
Established Patient Office Visit  Subjective   Patient ID: Lorraine Patton, female   DOB: 1965/07/17 Age: 56 y.o. MRN: [id number]   Chief Complaint  Patient presents with   Weight Check   HPI Pleasant 56 year old female presenting today for weight check on semaglutide. Has been using 1mg  weekly, tolerating well without side effects. Feels the medication is working very well and she is having good progress. Still exercising every day.  Has continued to make dietary modifications and prefers healthier choices with good portion control.  Hypertension: Was previously taking valsartan-HCTZ 160-25 mg daily however she started to have lightheaded episodes.  She monitor blood pressure at home and noted that her readings began to fall on the lower side of normal.  She cut her blood pressure pill in half and has been taking that daily for the last 2 months.  Her lightheaded episodes resolved and her blood pressure stayed well controlled.  Follows a low-sodium diet.  Exercise as above. Denies CP, SOB, palpitations, lower extremity edema, dizziness, headaches, or vision changes.  Hyperlipidemia: Taking atorvastatin 20 mg daily.  Her recent lipid panel showed that her cholesterol is looking extremely well and she is wondering if she should continue taking the atorvastatin or come off of it to see if her dietary modifications and weight loss have helped. Objective:    Vitals:   01/11/22 1516  BP: 121/78  Pulse: 77  Height: 5\' 7"  (1.702 m)  Weight: 186 lb (84.4 kg)  SpO2: 100%  BMI (Calculated): 29.12    Physical Exam Vitals and nursing note reviewed.  Constitutional:      General: She is not in acute distress.    Appearance: Normal appearance. She is not ill-appearing.  HENT:     Head: Normocephalic and atraumatic.  Cardiovascular:     Rate and Rhythm: Normal rate and regular rhythm.     Pulses: Normal pulses.     Heart sounds: Normal heart sounds.  Pulmonary:     Effort: Pulmonary effort is  normal. No respiratory distress.     Breath sounds: Normal breath sounds. No wheezing, rhonchi or rales.  Skin:    General: Skin is warm and dry.  Neurological:     Mental Status: She is alert and oriented to person, place, and time.  Psychiatric:        Mood and Affect: Mood normal.        Behavior: Behavior normal.        Thought Content: Thought content normal.        Judgment: Judgment normal.   No results found for this or any previous visit (from the past 24 hour(s)).     The ASCVD Risk score (Arnett DK, et al., 2019) failed to calculate for the following reasons:   The valid total cholesterol range is 130 to 320 mg/dL   Assessment & Plan:   1. Essential hypertension Blood pressure very well controlled today.  Reducing medication to valsartan-HCTZ 80-12.5 mg daily.  Continue monitoring blood pressure at home with a goal of 130/80 or less.  Continue low-sodium diet, regular intentional exercise, and weight loss efforts.  2. Encounter for weight management Excellent weight loss to date.  She is doing very well on Ozempic so continue 1 mg weekly.  Continue dietary modifications and regular intentional exercise. - Semaglutide, 1 MG/DOSE, 4 MG/3ML SOPN; Inject 1 mg as directed once a week.  Dispense: 9 mL; Refill: 1  3. Hyperlipidemia Since she has done very well and her  numbers look so good, reducing atorvastatin to 10 mg daily.  Plan to recheck in 3 to 6 months.  Return in about 6 months (around 07/12/2022) for chronic disease follow up.  ___________________________________________ Clearnce Sorrel, DNP, APRN, FNP-BC Primary Care and [place]

## 2022-01-24 DIAGNOSIS — N289 Disorder of kidney and ureter, unspecified: Secondary | ICD-10-CM | POA: Diagnosis not present

## 2022-01-24 LAB — COMPREHENSIVE METABOLIC PANEL
Calcium: 9.5 (ref 8.7–10.7)
eGFR: 67

## 2022-01-24 LAB — BASIC METABOLIC PANEL
BUN: 18 (ref 4–21)
CO2: 27 — AB (ref 13–22)
Chloride: 103 (ref 99–108)
Creatinine: 1 (ref 0.5–1.1)
Glucose: 83
Potassium: 5 mEq/L (ref 3.5–5.1)
Sodium: 137 (ref 137–147)

## 2022-01-27 ENCOUNTER — Encounter: Payer: Self-pay | Admitting: Medical-Surgical

## 2022-02-07 ENCOUNTER — Other Ambulatory Visit (HOSPITAL_COMMUNITY)
Admission: RE | Admit: 2022-02-07 | Discharge: 2022-02-07 | Disposition: A | Discharge status: Home / Self Care | Payer: BC Managed Care – PPO | Source: Ambulatory Visit | Attending: Obstetrics & Gynecology | Admitting: Obstetrics & Gynecology

## 2022-02-07 ENCOUNTER — Ambulatory Visit (INDEPENDENT_AMBULATORY_CARE_PROVIDER_SITE_OTHER): Payer: BC Managed Care – PPO | Admitting: Obstetrics & Gynecology

## 2022-02-07 ENCOUNTER — Encounter: Payer: Self-pay | Admitting: Obstetrics & Gynecology

## 2022-02-07 VITALS — BP 124/74 | HR 88 | Ht 67.0 in | Wt 180.0 lb

## 2022-02-07 DIAGNOSIS — Z01419 Encounter for gynecological examination (general) (routine) without abnormal findings: Secondary | ICD-10-CM | POA: Diagnosis not present

## 2022-02-07 NOTE — Progress Notes (Signed)
Subjective:     Lorraine Patton is a 56 y.o. female here for a routine exam.  Current complaints: losing weight on ozembic.    Gynecologic History Patient's last menstrual period was 01/16/2018 (approximate). Contraception: post menopausal status Last Mammogram: 07/01/21 Last Pap Smear:  07/06/16 Last Colon Screening;  2015 Seat Belts:   Yes Sun Screen:   Yes Dental Check Up:  Yes Brush & Floss:  Yes   Obstetric History OB History  Gravida Para Term Preterm AB Living  2 2 2     2   SAB IAB Ectopic Multiple Live Births          2    # Outcome Date GA Lbr Len/2nd Weight Sex Delivery Anes PTL Lv  2 Term      Vag-Spont   LIV  1 Term      Vag-Spont   LIV     The following portions of the patient's history were reviewed and updated as appropriate: allergies, current medications, past family history, past medical history, past social history, past surgical history, and problem list.  Review of Systems Pertinent items noted in HPI and remainder of comprehensive ROS otherwise negative.    Objective:     Vitals:   02/07/22 1559  BP: 124/74  Pulse: 88  Weight: 180 lb (81.6 kg)  Height: 5\' 7"  (1.702 m)   Vitals:  WNL General appearance: alert, cooperative and no distress  HEENT: Normocephalic, without obvious abnormality, atraumatic Eyes: negative Throat: lips, mucosa, and tongue normal; teeth and gums normal  Respiratory: Clear to auscultation bilaterally  CV: Regular rate and rhythm  Breasts:  Normal appearance, no masses or tenderness, no nipple retraction or dimpling  GI: Soft, non-tender; bowel sounds normal; no masses,  no organomegaly  GU: External Genitalia:  Tanner V, no lesion Urethra:  No prolapse   Vagina: Pink, normal rugae, no blood or discharge  Cervix: No CMT, no lesion  Uterus:  Normal size and contour, non tender  Adnexa: Normal, no masses, non tender  Musculoskeletal: No edema, redness or tenderness in the calves or thighs  Skin: No lesions or rash   Lymphatic: Axillary adenopathy: none     Psychiatric: Normal mood and behavior        Assessment:    Healthy female exam.    Plan:    1.  Pap with cotesting 2.  Yearly mammograms 3.  Colon screening due at 58. $.  Health maint. Labs by PCP.

## 2022-02-07 NOTE — Progress Notes (Signed)
Last Mammogram: 07/01/21 Last Pap Smear:  07/06/16 Last Colon Screening;  2015 Seat Belts:   Yes Sun Screen:   Yes Dental Check Up:  Yes Brush & Floss:  Yes

## 2022-02-09 LAB — CYTOLOGY - PAP
Comment: NEGATIVE
Diagnosis: NEGATIVE
High risk HPV: NEGATIVE

## 2022-02-14 ENCOUNTER — Other Ambulatory Visit: Payer: Self-pay | Admitting: Medical-Surgical

## 2022-02-14 DIAGNOSIS — R928 Other abnormal and inconclusive findings on diagnostic imaging of breast: Secondary | ICD-10-CM

## 2022-03-02 ENCOUNTER — Ambulatory Visit
Admission: RE | Admit: 2022-03-02 | Discharge: 2022-03-02 | Disposition: A | Discharge status: Home / Self Care | Payer: BC Managed Care – PPO | Source: Ambulatory Visit | Attending: Medical-Surgical | Admitting: Medical-Surgical

## 2022-03-02 ENCOUNTER — Other Ambulatory Visit: Payer: Self-pay | Admitting: Medical-Surgical

## 2022-03-02 DIAGNOSIS — R928 Other abnormal and inconclusive findings on diagnostic imaging of breast: Secondary | ICD-10-CM

## 2022-03-02 DIAGNOSIS — Z7689 Persons encountering health services in other specified circumstances: Secondary | ICD-10-CM

## 2022-03-02 DIAGNOSIS — N6012 Diffuse cystic mastopathy of left breast: Secondary | ICD-10-CM | POA: Diagnosis not present

## 2022-03-02 DIAGNOSIS — N6310 Unspecified lump in the right breast, unspecified quadrant: Secondary | ICD-10-CM

## 2022-03-02 DIAGNOSIS — N6011 Diffuse cystic mastopathy of right breast: Secondary | ICD-10-CM | POA: Diagnosis not present

## 2022-03-02 DIAGNOSIS — R922 Inconclusive mammogram: Secondary | ICD-10-CM | POA: Diagnosis not present

## 2022-07-03 ENCOUNTER — Other Ambulatory Visit: Payer: Self-pay | Admitting: Medical-Surgical

## 2022-07-12 ENCOUNTER — Encounter: Payer: Self-pay | Admitting: Medical-Surgical

## 2022-07-12 ENCOUNTER — Ambulatory Visit (INDEPENDENT_AMBULATORY_CARE_PROVIDER_SITE_OTHER): Payer: BC Managed Care – PPO | Admitting: Medical-Surgical

## 2022-07-12 VITALS — BP 126/80 | HR 84 | Resp 20 | Ht 67.0 in | Wt 180.6 lb

## 2022-07-12 DIAGNOSIS — I10 Essential (primary) hypertension: Secondary | ICD-10-CM | POA: Diagnosis not present

## 2022-07-12 DIAGNOSIS — R7301 Impaired fasting glucose: Secondary | ICD-10-CM | POA: Diagnosis not present

## 2022-07-12 DIAGNOSIS — Z1329 Encounter for screening for other suspected endocrine disorder: Secondary | ICD-10-CM | POA: Diagnosis not present

## 2022-07-12 DIAGNOSIS — E782 Mixed hyperlipidemia: Secondary | ICD-10-CM

## 2022-07-12 DIAGNOSIS — Z7689 Persons encountering health services in other specified circumstances: Secondary | ICD-10-CM

## 2022-07-12 MED ORDER — OZEMPIC (1 MG/DOSE) 4 MG/3ML ~~LOC~~ SOPN: PEN_INJECTOR | SUBCUTANEOUS | 3 refills | Status: DC

## 2022-07-12 NOTE — Progress Notes (Signed)
        Established patient visit  History, exam, impression, and plan:  1. Essential hypertension Pleasant 57 year old female presenting today with a history of HTN managed with valsartan-HCTZ 80-12.5mg  daily. Tolerating well without side effects. Checking BP at home with readings at goal, similar to today's readings. Following a low sodium diet. Staying physically active. Normal exam findings. No CP, SOB, HA, LE edema, palpitations, dizziness, or vision changes. Checking labs as below. Continue Valsartan-HCTZ as prescribed.  - CBC with Differential/Platelet - COMPLETE METABOLIC PANEL WITH GFR - Lipid panel  2. Elevated fasting glucose History of elevated A1c. Rechecking today.  - Hemoglobin A1c  3. Mixed hyperlipidemia Taking Atorvastatin 10mg  daily, tolerating well without side effects. Following a low fat heart healthy diet. Rechecking labs today. Continue Atorvastatin as prescribed.  - COMPLETE METABOLIC PANEL WITH GFR - Lipid panel  4. Thyroid disorder screen Checking TSH.  - TSH  5. Encounter for weight management Has been doing well on Ozempic 1mg  every other week. Feels this has been working well to help maintain her weight loss and continue to provide appetite suppression. Has increased her protein intake and strength training in an effort to gain muscle back. Happy with her progress and would like to continue the medication to help with weight and prediabetes. Continue Ozempic as prescribed.  - Semaglutide, 1 MG/DOSE, (OZEMPIC, 1 MG/DOSE,) 4 MG/3ML SOPN; INJECT 1 MG SUBCUTANEOUSLY ONCE A WEEK  Dispense: 9 mL; Refill: 3  Procedures performed this visit: None.  Return in about 6 months (around 01/11/2023) for chronic disease follow up.  __________________________________ Thayer Ohm, DNP, APRN, FNP-BC Primary Care and Sports Medicine [facility] [place]

## 2022-07-15 DIAGNOSIS — Z1331 Encounter for screening for depression: Secondary | ICD-10-CM | POA: Diagnosis not present

## 2022-07-15 DIAGNOSIS — Z23 Encounter for immunization: Secondary | ICD-10-CM | POA: Diagnosis not present

## 2022-07-22 DIAGNOSIS — R7301 Impaired fasting glucose: Secondary | ICD-10-CM | POA: Diagnosis not present

## 2022-07-22 DIAGNOSIS — E782 Mixed hyperlipidemia: Secondary | ICD-10-CM | POA: Diagnosis not present

## 2022-07-22 DIAGNOSIS — Z1329 Encounter for screening for other suspected endocrine disorder: Secondary | ICD-10-CM | POA: Diagnosis not present

## 2022-07-22 DIAGNOSIS — I10 Essential (primary) hypertension: Secondary | ICD-10-CM | POA: Diagnosis not present

## 2022-07-23 LAB — COMPLETE METABOLIC PANEL WITH GFR
AG Ratio: 1.8 (calc) (ref 1.0–2.5)
ALT: 24 U/L (ref 6–29)
AST: 21 U/L (ref 10–35)
Albumin: 4.1 g/dL (ref 3.6–5.1)
Alkaline phosphatase (APISO): 65 U/L (ref 37–153)
BUN/Creatinine Ratio: 23 (calc) — ABNORMAL HIGH (ref 6–22)
BUN: 24 mg/dL (ref 7–25)
CO2: 28 mmol/L (ref 20–32)
Calcium: 9.3 mg/dL (ref 8.6–10.4)
Chloride: 106 mmol/L (ref 98–110)
Creat: 1.05 mg/dL — ABNORMAL HIGH (ref 0.50–1.03)
Globulin: 2.3 g/dL (calc) (ref 1.9–3.7)
Glucose, Bld: 80 mg/dL (ref 65–99)
Potassium: 4.7 mmol/L (ref 3.5–5.3)
Sodium: 140 mmol/L (ref 135–146)
Total Bilirubin: 0.5 mg/dL (ref 0.2–1.2)
Total Protein: 6.4 g/dL (ref 6.1–8.1)
eGFR: 62 mL/min/{1.73_m2} (ref >=60)

## 2022-07-23 LAB — CBC WITH DIFFERENTIAL/PLATELET
Absolute Monocytes: 496 cells/uL (ref 200–950)
Basophils Absolute: 19 cells/uL (ref 0–200)
Basophils Relative: 0.5 %
Eosinophils Absolute: 70 cells/uL (ref 15–500)
Eosinophils Relative: 1.9 %
HCT: 40.5 % (ref 35.0–45.0)
Hemoglobin: 13.7 g/dL (ref 11.7–15.5)
Lymphs Abs: 1406 cells/uL (ref 850–3900)
MCH: 30.2 pg (ref 27.0–33.0)
MCHC: 33.8 g/dL (ref 32.0–36.0)
MCV: 89.4 fL (ref 80.0–100.0)
MPV: 9.4 fL (ref 7.5–12.5)
Monocytes Relative: 13.4 %
Neutro Abs: 1709 cells/uL (ref 1500–7800)
Neutrophils Relative %: 46.2 %
Platelets: 274 10*3/uL (ref 140–400)
RBC: 4.53 10*6/uL (ref 3.80–5.10)
RDW: 12.5 % (ref 11.0–15.0)
Total Lymphocyte: 38 %
WBC: 3.7 10*3/uL — ABNORMAL LOW (ref 3.8–10.8)

## 2022-07-23 LAB — LIPID PANEL
Cholesterol: 140 mg/dL (ref <200)
HDL: 51 mg/dL (ref >=50)
LDL Cholesterol (Calc): 72 mg/dL (calc)
Non-HDL Cholesterol (Calc): 89 mg/dL (calc) (ref <130)
Total CHOL/HDL Ratio: 2.7 (calc) (ref <5.0)
Triglycerides: 83 mg/dL (ref <150)

## 2022-07-23 LAB — HEMOGLOBIN A1C
Hgb A1c MFr Bld: 5.5 % of total Hgb (ref <5.7)
Mean Plasma Glucose: 111 mg/dL
eAG (mmol/L): 6.2 mmol/L

## 2022-07-23 LAB — TSH: TSH: 1.61 mIU/L (ref 0.40–4.50)

## 2022-07-28 ENCOUNTER — Encounter: Payer: Self-pay | Admitting: Medical-Surgical

## 2022-08-04 ENCOUNTER — Telehealth: Payer: Self-pay

## 2022-08-04 NOTE — Telephone Encounter (Addendum)
Initiated Prior authorization ZOX:WRUEAVW (1 MG/DOSE) 4MG /3ML pen-injectors Via: Covermymeds Case/Key:[id number] Status: denied as of 08/04/22 Reason:We denied your request because we did not see what we need to approve the drug you  asked for, (Ozempic). We may be able to approve this drug when we see certain records  (documentation that your diagnosis of type 2 diabetes mellitus has been verified by a  history of one of the following: hemoglobin A1c [A1C] greater than or equal to 6.5  percent, fasting plasma glucose [FPG] greater than or equal to 126 milligrams per  deciliters [mg/dL] [after fasting for at least 8 hours]; two hour plasma glucose greater  than or equal to 200 mg/dL as part of an oral glucose tolerance test [75 grams of oral  glucose after fasting for at least 8 hours], or symptoms of hyperglycemia [including  polyuria, polydipsia, polyphagia] and a random plasma glucose greater than or equal to  200 mg/dL). If we receive these records, we may need more information (if you have  tried and cannot take certain drugs; if you have a certain illness; if you are taking certain  other drugs). We based this decision on your health plan's prior authorization clinical  criteria named Glucagon-Like Peptide-1 (GLP-1) Receptor Agonist and GlucoseDependent Insulinotropic Polypeptide (GIP)/Glucagon-Like Peptide-1 (GLP-1) Receptor  Agonist. Medications that are not medically necessary are an exclusion under your plan benefits  and are not covered. Notified Pt via: Mychart

## 2022-08-22 ENCOUNTER — Encounter: Payer: Self-pay | Admitting: Medical-Surgical

## 2022-08-22 MED ORDER — WEGOVY 1 MG/0.5ML ~~LOC~~ SOAJ: 1.0000 mg | SUBCUTANEOUS | 0 refills | Status: DC

## 2022-08-26 DIAGNOSIS — Z23 Encounter for immunization: Secondary | ICD-10-CM | POA: Diagnosis not present

## 2022-09-02 ENCOUNTER — Other Ambulatory Visit: Payer: Self-pay | Admitting: Medical-Surgical

## 2022-09-02 ENCOUNTER — Ambulatory Visit
Admission: RE | Admit: 2022-09-02 | Discharge: 2022-09-02 | Disposition: A | Discharge status: Home / Self Care | Payer: BC Managed Care – PPO | Source: Ambulatory Visit | Attending: Medical-Surgical | Admitting: Medical-Surgical

## 2022-09-02 DIAGNOSIS — N6310 Unspecified lump in the right breast, unspecified quadrant: Secondary | ICD-10-CM

## 2022-09-02 DIAGNOSIS — N6315 Unspecified lump in the right breast, overlapping quadrants: Secondary | ICD-10-CM | POA: Diagnosis not present

## 2022-09-02 DIAGNOSIS — R928 Other abnormal and inconclusive findings on diagnostic imaging of breast: Secondary | ICD-10-CM

## 2022-09-02 DIAGNOSIS — N6325 Unspecified lump in the left breast, overlapping quadrants: Secondary | ICD-10-CM | POA: Diagnosis not present

## 2022-09-07 ENCOUNTER — Telehealth: Payer: Self-pay

## 2022-09-07 ENCOUNTER — Encounter: Payer: Self-pay | Admitting: Medical-Surgical

## 2022-09-07 NOTE — Telephone Encounter (Signed)
Initiated Prior authorization UJW:JXBJYN 1MG /0.5ML auto-injectors  Via: Covermymeds Case/Key:[id number] Status: Pending as of 09/07/22 Reason: Notified Pt via: Mychart

## 2022-10-25 ENCOUNTER — Encounter: Payer: Self-pay | Admitting: Medical-Surgical

## 2022-11-01 MED ORDER — ZEPBOUND 2.5 MG/0.5ML ~~LOC~~ SOAJ
2.5000 mg | SUBCUTANEOUS | 0 refills | Status: DC
Start: 2022-11-01 — End: 2022-11-28

## 2022-11-04 ENCOUNTER — Telehealth: Payer: Self-pay | Admitting: *Deleted

## 2022-11-04 NOTE — Telephone Encounter (Signed)
Left patient a message to call and schedule possible pelvic floor prolapse appointment with Dr. Penne Lash as requested.

## 2022-11-10 ENCOUNTER — Telehealth: Payer: Self-pay

## 2022-11-10 NOTE — Telephone Encounter (Signed)
PA has been submitted for Zepbound.  Lorraine Patton (Key: [id number]) PA Case ID #: [id number] Rx #: [id number]

## 2022-11-17 NOTE — Telephone Encounter (Signed)
Initiated Prior authorization for: ZEPBOUND 2.5 MG/0.5 ML PEN Via: Covermymeds Case/Key:[id number]  Status: denied as of 11/17/22 Reason:We denied your request because we did not see what we need to approve the drug you  asked for, (Zepbound). We may be able to approve this drug when we see certain  records (record showing you were at a certain height and weight [body mass index (BMI)  greater than or equal to 30 kilograms per meters squared (kg/m2)] when you started this  drug; record showing you had a body mass index [BMI] greater than or equal to 27  kilograms per meters squared [kg/m2] and one or more weight-related conditions  [hypertension, type II diabetes mellitus, or dyslipidemia] when you started this drug). We  based this decision on your health plan's prior authorization clinical criteria named  Zepbound (tirzepatide). Medications that are not medically necessary are an exclusion under your plan b Notified Pt via: Mychart     This request was  updated to reflect the outcome of the case submission form another user

## 2022-11-18 ENCOUNTER — Telehealth: Payer: Self-pay

## 2022-11-18 NOTE — Telephone Encounter (Addendum)
Resubmission per pcp request Initiated Prior authorization ZOX:WRUEAVWU 2.5MG /0.5ML pen-injectors Via: Covermymeds Case/Key:[id number] Status: Denied  as of 11/18/22 Reason:Medications that are not medically necessary are an exclusion under your plan benefits  and are not covered. Notified Pt via: Mychart

## 2022-11-28 ENCOUNTER — Ambulatory Visit (INDEPENDENT_AMBULATORY_CARE_PROVIDER_SITE_OTHER): Payer: BC Managed Care – PPO | Admitting: Family Medicine

## 2022-11-28 ENCOUNTER — Encounter: Payer: Self-pay | Admitting: Family Medicine

## 2022-11-28 VITALS — BP 145/83 | HR 89 | Ht 67.0 in | Wt 188.0 lb

## 2022-11-28 DIAGNOSIS — N8110 Cystocele, unspecified: Secondary | ICD-10-CM | POA: Insufficient documentation

## 2022-11-28 DIAGNOSIS — N3941 Urge incontinence: Secondary | ICD-10-CM | POA: Insufficient documentation

## 2022-11-28 NOTE — Progress Notes (Signed)
   Subjective:    Patient ID: Lorraine Patton is a 57 y.o. female presenting with No chief complaint on file.  on 11/28/2022  HPI: Reports bulge in vaginal. Felt something coming out in January. When she is jogging she feels pressure and feels like she has a tampon in that is falling out. Can feel something smooth. Reports no stress incontinence. Does note some sense of urgency. She has h/o SVD x 2 with largest baby at 10 lb 4 oz. Still having hot flashes. Menopause x 9 years.   Review of Systems  Constitutional:  Negative for chills and fever.  Respiratory:  Negative for shortness of breath.   Cardiovascular:  Negative for chest pain.  Gastrointestinal:  Negative for abdominal pain, nausea and vomiting.  Genitourinary:  Negative for dysuria.  Skin:  Negative for rash.      Objective:    BP (!) 145/83   Pulse 89   Ht 5\' 7"  (1.702 m)   Wt 188 lb (85.3 kg)   LMP 01/16/2018 (Approximate)   BMI 29.44 kg/m  Physical Exam Exam conducted with a chaperone present.  Constitutional:      General: She is not in acute distress.    Appearance: She is well-developed.  HENT:     Head: Normocephalic and atraumatic.  Eyes:     General: No scleral icterus. Cardiovascular:     Rate and Rhythm: Normal rate.  Pulmonary:     Effort: Pulmonary effort is normal.  Abdominal:     Palpations: Abdomen is soft.  Genitourinary:    Comments: Cystocele noted, other pelvic organs with good support. Musculoskeletal:     Cervical back: Neck supple.  Skin:    General: Skin is warm and dry.  Neurological:     Mental Status: She is alert and oriented to person, place, and time.         Assessment & Plan:   Problem List Items Addressed This Visit       Unprioritized   Female cystocele - Primary    Not really interested in a pessary at this time. Remains sexually active and an active lifestyle. Would want potential surgery. Refer to Uro/GYN      Relevant Orders   Ambulatory referral to  Urogynecology   Urge incontinence    Discussed meds, side fx, bladder training.       No follow-ups on file.  Reva Bores, MD 11/28/2022 2:56 PM

## 2022-11-28 NOTE — Assessment & Plan Note (Signed)
Not really interested in a pessary at this time. Remains sexually active and an active lifestyle. Would want potential surgery. Refer to Uro/GYN

## 2022-11-28 NOTE — Assessment & Plan Note (Signed)
Discussed meds, side fx, bladder training.

## 2022-11-30 ENCOUNTER — Telehealth: Payer: Self-pay | Admitting: Medical-Surgical

## 2022-12-01 ENCOUNTER — Telehealth (INDEPENDENT_AMBULATORY_CARE_PROVIDER_SITE_OTHER): Payer: BC Managed Care – PPO | Admitting: Medical-Surgical

## 2022-12-01 ENCOUNTER — Encounter: Payer: Self-pay | Admitting: Medical-Surgical

## 2022-12-01 DIAGNOSIS — R7301 Impaired fasting glucose: Secondary | ICD-10-CM | POA: Diagnosis not present

## 2022-12-01 DIAGNOSIS — U071 COVID-19: Secondary | ICD-10-CM | POA: Diagnosis not present

## 2022-12-01 DIAGNOSIS — I10 Essential (primary) hypertension: Secondary | ICD-10-CM

## 2022-12-01 DIAGNOSIS — N289 Disorder of kidney and ureter, unspecified: Secondary | ICD-10-CM

## 2022-12-01 DIAGNOSIS — E782 Mixed hyperlipidemia: Secondary | ICD-10-CM

## 2022-12-01 MED ORDER — HYDROCOD POLI-CHLORPHE POLI ER 10-8 MG/5ML PO SUER: 5.0000 mL | Freq: Two times a day (BID) | ORAL | 0 refills | Status: DC | PRN

## 2022-12-01 MED ORDER — NIRMATRELVIR/RITONAVIR (PAXLOVID)TABLET: 3.0000 | ORAL_TABLET | Freq: Two times a day (BID) | ORAL | 0 refills | Status: AC

## 2022-12-01 NOTE — Telephone Encounter (Signed)
Lorraine Patton

## 2022-12-01 NOTE — Progress Notes (Signed)
Virtual Visit via Video Note  I connected with Lorraine Patton on 12/01/22 at 10:30 AM EDT by a video enabled telemedicine application and verified that I am speaking with the correct person using two identifiers.   I discussed the limitations of evaluation and management by telemedicine and the availability of in person appointments. The patient expressed understanding and agreed to proceed.  Patient location: home Provider locations: office  Subjective:    CC: COVID-19 positive  HPI: Pleasant 57 year old female presenting today via MyChart video visit with reports of testing positive for COVID.  She started with symptoms on Monday including scratchy throat, headache, body aches, poor appetite, sinus congestion, yellow nasal discharge, nonproductive cough, and postnasal drip.  She has been using Mucinex sinus max with moderate relief of symptoms temporarily.  This is her first time having COVID and she is interested in antivirals.  Denies fever, chills, nausea, vomiting, diarrhea, shortness of breath, and chest pain.  Eating and drinking without difficulty.   Past medical history, Surgical history, Family history not pertinant except as noted below, Social history, Allergies, and medications have been entered into the medical record, reviewed, and corrections made.   Review of Systems: See HPI for pertinent positives and negatives.   Objective:    General: Speaking clearly in complete sentences without any shortness of breath.  Alert and oriented x3.  Normal judgment. No apparent acute distress.  Impression and Recommendations:    1. COVID-19 virus infection Currently having moderate COVID-19 infection symptoms.  No red flag symptoms of concern today.  Discussed various options for treatment.  Starting Paxlovid twice daily x 5 days.  Adding Tussionex twice daily as needed for cough.  Okay to continue over-the-counter options for symptom management.  Reviewed CDC recommendations for  quarantine.  2. Essential hypertension Plan to come in for an in person appointment.  Labs entered so we can discuss results when she comes in. - CBC with Differential/Platelet - CMP14+EGFR - Lipid panel  3. Elevated fasting glucose Lab orders entered to follow-up at her next appointment - Hemoglobin A1c  4. Mixed hyperlipidemia Lab orders for upcoming appointment. - CMP14+EGFR - Lipid panel   I discussed the assessment and treatment plan with the patient. The patient was provided an opportunity to ask questions and all were answered. The patient agreed with the plan and demonstrated an understanding of the instructions.   The patient was advised to call back or seek an in-person evaluation if the symptoms worsen or if the condition fails to improve as anticipated.  Return if symptoms worsen or fail to improve.  Thayer Ohm, DNP, APRN, FNP-BC [facility] and Sports Medicine

## 2022-12-15 DIAGNOSIS — E782 Mixed hyperlipidemia: Secondary | ICD-10-CM | POA: Diagnosis not present

## 2022-12-15 DIAGNOSIS — I10 Essential (primary) hypertension: Secondary | ICD-10-CM | POA: Diagnosis not present

## 2022-12-15 DIAGNOSIS — R7301 Impaired fasting glucose: Secondary | ICD-10-CM | POA: Diagnosis not present

## 2022-12-16 LAB — LIPID PANEL
Chol/HDL Ratio: 3.7 {ratio} (ref 0.0–4.4)
Cholesterol, Total: 178 mg/dL (ref 100–199)
HDL: 48 mg/dL (ref >39)
LDL Chol Calc (NIH): 106 mg/dL — ABNORMAL HIGH (ref 0–99)
Triglycerides: 136 mg/dL (ref 0–149)
VLDL Cholesterol Cal: 24 mg/dL (ref 5–40)

## 2022-12-16 LAB — CBC WITH DIFFERENTIAL/PLATELET
Basophils Absolute: 0 10*3/uL (ref 0.0–0.2)
Basos: 1 %
EOS (ABSOLUTE): 0.1 10*3/uL (ref 0.0–0.4)
Eos: 2 %
Hematocrit: 41.3 % (ref 34.0–46.6)
Hemoglobin: 13.8 g/dL (ref 11.1–15.9)
Immature Grans (Abs): 0 10*3/uL (ref 0.0–0.1)
Immature Granulocytes: 0 %
Lymphocytes Absolute: 2.2 10*3/uL (ref 0.7–3.1)
Lymphs: 47 %
MCH: 29.9 pg (ref 26.6–33.0)
MCHC: 33.4 g/dL (ref 31.5–35.7)
MCV: 89 fL (ref 79–97)
Monocytes Absolute: 0.5 10*3/uL (ref 0.1–0.9)
Monocytes: 10 %
Neutrophils Absolute: 1.8 10*3/uL (ref 1.4–7.0)
Neutrophils: 40 %
Platelets: 286 10*3/uL (ref 150–450)
RBC: 4.62 x10E6/uL (ref 3.77–5.28)
RDW: 12.9 % (ref 11.7–15.4)
WBC: 4.6 10*3/uL (ref 3.4–10.8)

## 2022-12-16 LAB — CMP14+EGFR
ALT: 44 [IU]/L — ABNORMAL HIGH (ref 0–32)
AST: 32 [IU]/L (ref 0–40)
Albumin: 4.3 g/dL (ref 3.8–4.9)
Alkaline Phosphatase: 76 [IU]/L (ref 44–121)
BUN/Creatinine Ratio: 21 (ref 9–23)
BUN: 23 mg/dL (ref 6–24)
Bilirubin Total: 0.4 mg/dL (ref 0.0–1.2)
CO2: 24 mmol/L (ref 20–29)
Calcium: 9.4 mg/dL (ref 8.7–10.2)
Chloride: 102 mmol/L (ref 96–106)
Creatinine, Ser: 1.11 mg/dL — ABNORMAL HIGH (ref 0.57–1.00)
Globulin, Total: 2.7 g/dL (ref 1.5–4.5)
Glucose: 89 mg/dL (ref 70–99)
Potassium: 4.9 mmol/L (ref 3.5–5.2)
Sodium: 139 mmol/L (ref 134–144)
Total Protein: 7 g/dL (ref 6.0–8.5)
eGFR: 57 mL/min/{1.73_m2} — ABNORMAL LOW (ref >59)

## 2022-12-16 LAB — HEMOGLOBIN A1C
Est. average glucose Bld gHb Est-mCnc: 117 mg/dL
Hgb A1c MFr Bld: 5.7 % — ABNORMAL HIGH (ref 4.8–5.6)

## 2022-12-16 NOTE — Addendum Note (Signed)
Addended byChristen Butter on: 12/16/2022 02:29 PM   Modules accepted: Orders

## 2022-12-19 ENCOUNTER — Encounter: Payer: Self-pay | Admitting: Medical-Surgical

## 2022-12-19 ENCOUNTER — Ambulatory Visit (INDEPENDENT_AMBULATORY_CARE_PROVIDER_SITE_OTHER): Payer: BC Managed Care – PPO | Admitting: Medical-Surgical

## 2022-12-19 VITALS — BP 130/72 | HR 92 | Resp 20 | Ht 67.0 in | Wt 191.9 lb

## 2022-12-19 DIAGNOSIS — E782 Mixed hyperlipidemia: Secondary | ICD-10-CM

## 2022-12-19 DIAGNOSIS — E66811 Obesity, class 1: Secondary | ICD-10-CM | POA: Diagnosis not present

## 2022-12-19 DIAGNOSIS — E6609 Other obesity due to excess calories: Secondary | ICD-10-CM

## 2022-12-19 DIAGNOSIS — I10 Essential (primary) hypertension: Secondary | ICD-10-CM | POA: Diagnosis not present

## 2022-12-19 DIAGNOSIS — Z23 Encounter for immunization: Secondary | ICD-10-CM | POA: Diagnosis not present

## 2022-12-19 DIAGNOSIS — N289 Disorder of kidney and ureter, unspecified: Secondary | ICD-10-CM

## 2022-12-19 DIAGNOSIS — Z6830 Body mass index (BMI) 30.0-30.9, adult: Secondary | ICD-10-CM

## 2022-12-19 MED ORDER — VALSARTAN-HYDROCHLOROTHIAZIDE 80-12.5 MG PO TABS
1.0000 | ORAL_TABLET | Freq: Every day | ORAL | 3 refills | Status: DC
Start: 2022-12-19 — End: 2023-09-04

## 2022-12-19 MED ORDER — WEGOVY 0.25 MG/0.5ML ~~LOC~~ SOAJ
0.2500 mg | SUBCUTANEOUS | 0 refills | Status: DC
Start: 2022-12-19 — End: 2023-01-12

## 2022-12-19 MED ORDER — ATORVASTATIN CALCIUM 10 MG PO TABS: 10.0000 mg | ORAL_TABLET | Freq: Every day | ORAL | 3 refills | Status: DC

## 2022-12-19 NOTE — Progress Notes (Signed)
        Established patient visit  History, exam, impression, and plan:  1. Essential hypertension Pleasant 57 year old female presenting today with a history of hypertension.  She has been taking valsartan-hydrochlorothiazide 80-12.5 mg daily, tolerating well without side effects.  Occasionally checking blood pressure at home with readings at goal.  Following low-sodium diet and exercises regularly.  She is working to lose some weight but this has been difficult since she was taken off of Ozempic.  Denies concerning symptoms today.  Cardiopulmonary exam normal.  Blood pressure at goal.  Labs up-to-date.  Continue valsartan-HCTZ as prescribed. - valsartan-hydrochlorothiazide (DIOVAN-HCT) 80-12.5 MG tablet; Take 1 tablet by mouth daily.  Dispense: 90 tablet; Refill: 3  2. Mixed hyperlipidemia History of hyperlipidemia currently treated with atorvastatin 10 mg daily.  Tolerating well without side effects.  Following a low-fat heart healthy diet in addition to the above lifestyle and dietary modifications.  Up-to-date on lipid checks.  Continue atorvastatin.  3. Abnormal kidney function Recent issues with abnormal kidney function.  She would like to have these rechecked which I think is reasonable.  Plan to recheck BMP for further evaluation. - BASIC METABOLIC PANEL WITH GFR  4. Class I obesity She was previously doing well on Ozempic however insurance coverage lapsed and they will no longer consider coverage without the type 2 diabetes diagnosis.  She is working with exercise and dietary modifications but has not been able to lose further weight.  After discussion, plan to try for [facility] since she does have a BMI of 30 and has weight related comorbidities such as hypertension and hyperlipidemia.  Of note she is also prediabetic.  If we cannot get this covered, we will try for Zepbound.  5. Need for influenza vaccination We vaccine given in office today. - Flu vaccine trivalent PF, 6mos and  older(Flulaval,Afluria,Fluarix,Fluzone)  Procedures performed this visit: None.  Return in about 6 months (around 06/19/2023) for chronic disease follow up.  __________________________________ Thayer Ohm, DNP, APRN, FNP-BC Primary Care and Sports Medicine [facility]

## 2023-01-02 ENCOUNTER — Encounter: Payer: Self-pay | Admitting: Medical-Surgical

## 2023-01-03 ENCOUNTER — Encounter: Payer: Self-pay | Admitting: Medical-Surgical

## 2023-01-12 ENCOUNTER — Encounter: Payer: Self-pay | Admitting: Obstetrics

## 2023-01-12 ENCOUNTER — Ambulatory Visit (INDEPENDENT_AMBULATORY_CARE_PROVIDER_SITE_OTHER): Payer: BC Managed Care – PPO | Admitting: Obstetrics

## 2023-01-12 VITALS — BP 122/78 | HR 76 | Ht 66.54 in | Wt 195.4 lb

## 2023-01-12 DIAGNOSIS — R350 Frequency of micturition: Secondary | ICD-10-CM | POA: Insufficient documentation

## 2023-01-12 DIAGNOSIS — N3941 Urge incontinence: Secondary | ICD-10-CM

## 2023-01-12 DIAGNOSIS — N8110 Cystocele, unspecified: Secondary | ICD-10-CM | POA: Diagnosis not present

## 2023-01-12 LAB — POCT URINALYSIS DIPSTICK
Bilirubin, UA: NEGATIVE
Blood, UA: NEGATIVE
Glucose, UA: NEGATIVE
Ketones, UA: NEGATIVE
Nitrite, UA: POSITIVE
Protein, UA: NEGATIVE
Spec Grav, UA: 1.015 (ref 1.010–1.025)
Urobilinogen, UA: 0.2 U/dL
pH, UA: 5 (ref 5.0–8.0)

## 2023-01-12 NOTE — Assessment & Plan Note (Addendum)
-   minimal symptoms 1x/month - drinks 120oz of fluid/day, encouraged fluid management and bladder training with handout and instructions provided.  - POCT UA + nit only, denies signs or symptoms of UTI. No additional testing warranted - bladder scan PVR 11mL WNL - We discussed the symptoms of overactive bladder (OAB), which include urinary urgency, urinary frequency, nocturia, with or without urge incontinence.  While we do not know the exact etiology of OAB, several treatment options exist. We discussed management including behavioral therapy (decreasing bladder irritants, urge suppression strategies, timed voids, bladder retraining), physical therapy, medication; for refractory cases posterior tibial nerve stimulation, sacral neuromodulation, and intravesical botulinum toxin injection.  For anticholinergic medications, we discussed the potential side effects of anticholinergics including dry eyes, dry mouth, constipation, cognitive impairment and urinary retention. For Beta-3 agonist medication, we discussed the potential side effect of elevated blood pressure which is more likely to occur in individuals with uncontrolled hypertension. - Cr 1.11 on 12/15/19 with baseline 1 and prior negative nephrology workup per patient - continue kegel's exercsises

## 2023-01-12 NOTE — Assessment & Plan Note (Addendum)
-   symptoms started after discontinuation of Ozempic with weight gain 19lb - For treatment of pelvic organ prolapse, we discussed options for management including expectant management, conservative management, and surgical management, such as Kegels, a pessary, pelvic floor physical therapy, and specific surgical procedures. - most bothered at the time of exercise and stopped jogging - encouraged to continue Kegel exercises with handout provided - pt desires to return in 02/2023 for pessary trial, desires self management since she spends 6 months in [place] during the year and used tampons in the past - encouraged to continue active lifestyle and exercise for weight loss - reviewed risks and benefits of medical vs. Surgical treatment options with and without mesh - encouraged fiber supplementation or miralax for optimization of stool consistency due to intermittent sensation of incomplete emptying. - discussed possible need for incontinence pessary and occult SUI

## 2023-01-12 NOTE — Patient Instructions (Addendum)
You have a stage 2 (out of 4) prolapse.  We discussed the fact that it is not life threatening but there are several treatment options. For treatment of pelvic organ prolapse, we discussed options for management including expectant management, conservative management, and surgical management, such as Kegels, a pessary, pelvic floor physical therapy, and specific surgical procedures.     We discussed the symptoms of overactive bladder (OAB), which include urinary urgency, urinary frequency, night-time urination, with or without urge incontinence.  We discussed management including behavioral therapy (decreasing bladder irritants by following a bladder diet, urge suppression strategies, timed voids, bladder retraining), physical therapy, medication; and for refractory cases posterior tibial nerve stimulation, sacral neuromodulation, and intravesical botulinum toxin injection.   For treatment of stress urinary incontinence, which is leakage with physical activity/movement/strainging/coughing, we discussed expectant management versus nonsurgical options versus surgery. Nonsurgical options include weight loss, physical therapy, as well as a pessary.  Surgical options include a midurethral sling, which is a synthetic mesh sling that acts like a hammock under the urethra to prevent leakage of urine, a Burch urethropexy, and transurethral injection of a bulking agent.   Women should try to eat at least 21 to 25 grams of fiber a day, while men should aim for 30 to 38 grams a day. You can add fiber to your diet with food or a fiber supplement such as psyllium (metamucil), benefiber, or fibercon.   Here's a look at how much dietary fiber is found in some common foods. When buying packaged foods, check the Nutrition Facts label for fiber content. It can vary among brands.  Fruits Serving size Total fiber (grams)*  Raspberries 1 cup 8.0  Pear 1 medium 5.5  Apple, with skin 1 medium 4.5  Banana 1 medium 3.0   Orange 1 medium 3.0  Strawberries 1 cup 3.0   Vegetables Serving size Total fiber (grams)*  Green peas, boiled 1 cup 9.0  Broccoli, boiled 1 cup chopped 5.0  Turnip greens, boiled 1 cup 5.0  Brussels sprouts, boiled 1 cup 4.0  Potato, with skin, baked 1 medium 4.0  Sweet corn, boiled 1 cup 3.5  Cauliflower, raw 1 cup chopped 2.0  Carrot, raw 1 medium 1.5   Grains Serving size Total fiber (grams)*  Spaghetti, whole-wheat, cooked 1 cup 6.0  Barley, pearled, cooked 1 cup 6.0  Bran flakes 3/4 cup 5.5  Quinoa, cooked 1 cup 5.0  Oat bran muffin 1 medium 5.0  Oatmeal, instant, cooked 1 cup 5.0  Popcorn, air-popped 3 cups 3.5  Brown rice, cooked 1 cup 3.5  Bread, whole-wheat 1 slice 2.0  Bread, rye 1 slice 2.0   Legumes, nuts and seeds Serving size Total fiber (grams)*  Split peas, boiled 1 cup 16.0  Lentils, boiled 1 cup 15.5  Black beans, boiled 1 cup 15.0  Baked beans, canned 1 cup 10.0  Chia seeds 1 ounce 10.0  Almonds 1 ounce (23 nuts) 3.5  Pistachios 1 ounce (49 nuts) 3.0  Sunflower kernels 1 ounce 3.0  *Rounded to nearest 0.5 gram. Source: [employer] for [employer], [employer]	[place]

## 2023-01-12 NOTE — Progress Notes (Signed)
New Patient Evaluation and Consultation  Referring Provider: Reva Bores, MD PCP: Christen Butter, NP Date of Service: 01/12/2023  SUBJECTIVE Chief Complaint: New Patient (Initial Visit) Marland KitchenJhaniya Patton is a 57 y.o. female here to today for cystocele.)  History of Present Illness: Lorraine Patton is a 57 y.o. White or Caucasian female seen in consultation at the request of Dr Shawnie Pons for evaluation of cystocele.    Pelvic pressure described as sensation of tampon falling out started around 9 months ago, denies bulge beyond vaginal opening. Stopped jogging due to pressure sensation with bladder.  Prior Ozempic use for weight loss from 06/2021-03/2022, discontinued due to insurance issues. Reports 18lbs weight gain since stopping ozempic.  Elevated Creatinine with baseline 1, prior evaluation by nephrologist around 8 years ago with 24hr urine  Urinary Symptoms: Leaks urine with with a full bladder Leaks 1 time(s) per months.  Denies pad use Patient is not bothered by UI symptoms.  Day time voids 8-10.  Nocturia: 1 times per night to void. Voiding dysfunction:  empties bladder well.  Patient does not use a catheter to empty bladder.  When urinating, patient feels dribbling after finishing for around 5 sec Drinks: 120oz water per day  UTIs:  1-2  UTI's in the last year, occurs when she holds her urine for prolonged periods of time Denies history of blood in urine, kidney or bladder stones, pyelonephritis, bladder cancer, and kidney cancer No results found for the last 90 days.   Pelvic Organ Prolapse Symptoms:                  [name]. It has been present for 9 months.  Patient Denies seeing a bulge.  This bulge is bothersome.  Bowel Symptom: Bowel movements: 1-2 time(s) per day Stool consistency: soft  Straining: no.  Splinting: no.  Incomplete evacuation: yes.  Patient Denies accidental bowel leakage / fecal incontinence Bowel regimen:  none, probiotics. Tried metamucil fiber supplementation in the past  Last colonoscopy, Results normal and due in 10 years [facility] Colonoscopy          Colonoscopy (Every 10 Years) Next due on 05/18/2024    05/19/2014  Done - REPORTED BY NOVANT   Only the first 1 history entries have been loaded, but more history exists.            Sexual Function Sexually active: yes.  Sexual orientation: Straight Pain with sex: No  Pelvic Pain Denies pelvic pain  Past Medical History:  Past Medical History:  Diagnosis Date   Abnormal Pap smear of cervix 1996   Essential (primary) hypertension 08/23/2014   white coat syndrome - needs to bring home cuff for verification at all visits   HLD (hyperlipidemia) 02/15/2014     Past Surgical History:   Past Surgical History:  Procedure Laterality Date   AUGMENTATION MAMMAPLASTY Bilateral    BREAST ENHANCEMENT SURGERY  2016 and 1997   CRYOTHERAPY  1996     Past OB/GYN History: OB History  Gravida Para Term Preterm AB Living  2 2 2     2   SAB IAB Ectopic Multiple Live Births          2    # Outcome Date GA Lbr Len/2nd Weight Sex Type Anes PTL Lv  2 Term    10 lb 4 oz (4.649 kg) M Vag-Forceps   LIV  1 Term    9 lb 8 oz (4.309 kg)  M Vag-Spont   LIV    Vaginal deliveries: 2, largest infant 10 pounds 4 ounces with perineal laceration, forceps/ Vacuum deliveries: 1, Cesarean section: 0 Menopausal: Yes, at age 27, Denies vaginal bleeding since menopause Contraception: none. Last pap smear.  Any history of abnormal pap smears: yes, in her 30s s/p cryotherapy    Component Value Date/Time   DIAGPAP  02/07/2022 1631    - Negative for intraepithelial lesion or malignancy (NILM)   DIAGPAP  01/30/2019 1055    - Negative for intraepithelial lesion or malignancy (NILM)   HPVHIGH Negative 02/07/2022 1631   HPVHIGH Negative 01/30/2019 1055   ADEQPAP  02/07/2022 1631    Satisfactory for evaluation; transformation zone component PRESENT.   ADEQPAP   01/30/2019 1055    Satisfactory for evaluation; transformation zone component PRESENT.   ADEQPAP (U) 07/06/2016 0000    UNSATISFACTORY for evaluation due to extremely scant cellularity. The specimen is processed and examined microscopically  but is found to be unsatisfactory for evaluation of an epithelial abnormality. Repeat study recommended.    Medications: Patient has a current medication list which includes the following prescription(s): atorvastatin, magnesium, multivitamin, omega-3 fatty acids, probiotic product, and valsartan-hydrochlorothiazide.   Allergies: Patient has No Known Allergies.   Social History:  Social History   Tobacco Use   Smoking status: Never   Smokeless tobacco: Never  Vaping Use   Vaping status: Never Used  Substance Use Topics   Alcohol use: Yes    Alcohol/week: 0.0 standard drinks of alcohol   Drug use: No    Relationship status: married Patient lives with her husband.   Patient is not employed. Regular exercise: Yes: tennis History of abuse: No  Family History:   Family History  Problem Relation Age of Onset   Myelodysplastic syndrome Sister    Cancer Sister    HIV/AIDS Brother    Diabetes Maternal Grandmother    Heart disease Maternal Grandfather    Hyperlipidemia Maternal Uncle      Review of Systems: Review of Systems  Constitutional:  Negative for fever, malaise/fatigue and weight loss.       Weight gain  Respiratory:  Negative for cough, shortness of breath and wheezing.   Cardiovascular:  Negative for chest pain, palpitations and leg swelling.  Gastrointestinal:  Negative for abdominal pain and blood in stool.  Genitourinary:  Positive for frequency and urgency. Negative for hematuria.       Bulge  Skin:  Negative for rash.  Neurological:  Negative for dizziness, weakness and headaches.  Endo/Heme/Allergies:  Does not bruise/bleed easily.       Hot flashes  Psychiatric/Behavioral:  Negative for depression. The patient is not  nervous/anxious.      OBJECTIVE Physical Exam: Vitals:   01/12/23 0839  BP: 122/78  Pulse: 76  Weight: 195 lb 6.4 oz (88.6 kg)  Height: 5' 6.54" (1.69 m)    Physical Exam Constitutional:      General: She is not in acute distress.    Appearance: Normal appearance.  Genitourinary:     Bladder and urethral meatus normal.     No lesions in the vagina.     Right Labia: No rash, tenderness, lesions, skin changes or Bartholin's cyst.    Left Labia: No tenderness, lesions, skin changes, Bartholin's cyst or rash.    No vaginal discharge, erythema, tenderness, bleeding, ulceration or granulation tissue.     Anterior and posterior vaginal prolapse present.    Mild vaginal atrophy present.  Right Adnexa: not tender, not full and no mass present.    Left Adnexa: not tender, not full and no mass present.    No cervical motion tenderness, discharge, friability, lesion, polyp or nabothian cyst.     Uterus is not enlarged, fixed, tender or irregular.     No uterine mass detected.    Urethral meatus caruncle not present.    No urethral prolapse, tenderness, mass, hypermobility, discharge or stress urinary incontinence with cough stress test present.     Bladder is not tender, urgency on palpation not present and masses not present.      Pelvic Floor: Levator muscle strength is 4/5.    Levator ani not tender, obturator internus not tender, no asymmetrical contractions present and no pelvic spasms present.    Symmetrical pelvic sensation, anal wink present and BC reflex present. Cardiovascular:     Rate and Rhythm: Normal rate.  Pulmonary:     Effort: Pulmonary effort is normal. No respiratory distress.  Abdominal:     General: There is no distension.     Palpations: There is no mass.     Tenderness: There is no abdominal tenderness.     Hernia: No hernia is present.  Neurological:     Mental Status: She is alert.  Vitals reviewed. Exam conducted with a chaperone present.       POP-Q:   POP-Q  -2                                            Aa   -2                                           Ba  -6                                              C   2                                            Gh  2                                            Pb  7                                            tvl   -2                                            Ap  -2  Bp  -6                                              D     Post-Void Residual (PVR) by Bladder Scan: In order to evaluate bladder emptying, we discussed obtaining a postvoid residual and patient agreed to this procedure.  Procedure: The ultrasound unit was placed on the patient's abdomen in the suprapubic region after the patient had voided.    Post Void Residual - 01/12/23 0850       Post Void Residual   Post Void Residual 11 mL              Laboratory Results: Lab Results  Component Value Date   COLORU yellow 01/12/2023   CLARITYU clear 01/12/2023   GLUCOSEUR Negative 01/12/2023   BILIRUBINUR Negative 01/12/2023   KETONESU Negative 01/12/2023   SPECGRAV 1.015 01/12/2023   RBCUR Negative 01/12/2023   PHUR 5.0 01/12/2023   PROTEINUR Negative 01/12/2023   UROBILINOGEN 0.2 01/12/2023   LEUKOCYTESUR 3+ (A) 02/25/2020    Lab Results  Component Value Date   CREATININE 1.11 (H) 12/15/2022   CREATININE 1.05 (H) 07/22/2022   CREATININE 1.0 01/24/2022    Lab Results  Component Value Date   HGBA1C 5.7 (H) 12/15/2022    Lab Results  Component Value Date   HGB 13.8 12/15/2022     ASSESSMENT AND PLAN Ms. Canlas is a 57 y.o. with:  1. Pelvic organ prolapse quantification stage 1 cystocele   2. Urge incontinence     Pelvic organ prolapse quantification stage 1 cystocele Assessment & Plan: - symptoms started after discontinuation of Ozempic with weight gain 19lb - For treatment of pelvic organ prolapse, we discussed options for  management including expectant management, conservative management, and surgical management, such as Kegels, a pessary, pelvic floor physical therapy, and specific surgical procedures. - most bothered at the time of exercise and stopped jogging - encouraged to continue Kegel exercises with handout provided - pt desires to return in 02/2023 for pessary trial, desires self management since she spends 6 months in [place] during the year and used tampons in the past - encouraged to continue active lifestyle and exercise for weight loss - reviewed risks and benefits of medical vs. Surgical treatment options with and without mesh - encouraged fiber supplementation or miralax for optimization of stool consistency due to intermittent sensation of incomplete emptying.   Urge incontinence Assessment & Plan: - minimal symptoms 1x/month - drinks 120oz of fluid/day, encouraged fluid management and bladder training with handout and instructions provided.  - POCT UA + nit only, denies signs or symptoms of UTI. No additional testing warranted - bladder scan PVR 11mL WNL - We discussed the symptoms of overactive bladder (OAB), which include urinary urgency, urinary frequency, nocturia, with or without urge incontinence.  While we do not know the exact etiology of OAB, several treatment options exist. We discussed management including behavioral therapy (decreasing bladder irritants, urge suppression strategies, timed voids, bladder retraining), physical therapy, medication; for refractory cases posterior tibial nerve stimulation, sacral neuromodulation, and intravesical botulinum toxin injection.  For anticholinergic medications, we discussed the potential side effects of anticholinergics including dry eyes, dry mouth, constipation, cognitive impairment and urinary retention. For Beta-3 agonist medication, we discussed the potential side effect of elevated blood pressure which is more likely to occur in individuals  with  uncontrolled hypertension. - Cr 1.11 on 12/15/19 with baseline 1 and prior negative nephrology workup per patient - continue kegel's exercsises  Orders: -     POCT urinalysis dipstick    Time spent: I spent 63 minutes dedicated to the care of this patient on the date of this encounter to include pre-visit review of records, face-to-face time with the patient discussing pelvic organ prolapse, urinary urgency and post visit documentation.    Loleta Chance, MD

## 2023-02-20 ENCOUNTER — Ambulatory Visit: Payer: BC Managed Care – PPO | Admitting: Obstetrics and Gynecology

## 2023-02-27 ENCOUNTER — Ambulatory Visit: Payer: BC Managed Care – PPO | Admitting: Obstetrics & Gynecology

## 2023-03-06 ENCOUNTER — Other Ambulatory Visit: Payer: Self-pay | Admitting: Medical-Surgical

## 2023-03-06 ENCOUNTER — Ambulatory Visit
Admission: RE | Admit: 2023-03-06 | Discharge: 2023-03-06 | Disposition: A | Discharge status: Home / Self Care | Payer: BC Managed Care – PPO | Source: Ambulatory Visit | Attending: Medical-Surgical | Admitting: Medical-Surgical

## 2023-03-06 DIAGNOSIS — N6310 Unspecified lump in the right breast, unspecified quadrant: Secondary | ICD-10-CM

## 2023-03-06 DIAGNOSIS — N6315 Unspecified lump in the right breast, overlapping quadrants: Secondary | ICD-10-CM | POA: Diagnosis not present

## 2023-03-06 DIAGNOSIS — N6325 Unspecified lump in the left breast, overlapping quadrants: Secondary | ICD-10-CM | POA: Diagnosis not present

## 2023-03-06 DIAGNOSIS — Z9882 Breast implant status: Secondary | ICD-10-CM | POA: Diagnosis not present

## 2023-05-05 DIAGNOSIS — W000XXA Fall on same level due to ice and snow, initial encounter: Secondary | ICD-10-CM | POA: Diagnosis not present

## 2023-05-05 DIAGNOSIS — Y9301 Activity, walking, marching and hiking: Secondary | ICD-10-CM | POA: Diagnosis not present

## 2023-05-05 DIAGNOSIS — S61411A Laceration without foreign body of right hand, initial encounter: Secondary | ICD-10-CM | POA: Diagnosis not present

## 2023-07-24 ENCOUNTER — Ambulatory Visit: Payer: BC Managed Care – PPO | Admitting: Medical-Surgical

## 2023-08-02 DIAGNOSIS — M79661 Pain in right lower leg: Secondary | ICD-10-CM | POA: Diagnosis not present

## 2023-08-02 DIAGNOSIS — I83891 Varicose veins of right lower extremities with other complications: Secondary | ICD-10-CM | POA: Diagnosis not present

## 2023-08-02 DIAGNOSIS — I87391 Chronic venous hypertension (idiopathic) with other complications of right lower extremity: Secondary | ICD-10-CM | POA: Diagnosis not present

## 2023-08-02 DIAGNOSIS — R600 Localized edema: Secondary | ICD-10-CM | POA: Diagnosis not present

## 2023-08-02 DIAGNOSIS — M7989 Other specified soft tissue disorders: Secondary | ICD-10-CM | POA: Diagnosis not present

## 2023-08-02 DIAGNOSIS — I10 Essential (primary) hypertension: Secondary | ICD-10-CM | POA: Diagnosis not present

## 2023-08-30 DIAGNOSIS — I83891 Varicose veins of right lower extremities with other complications: Secondary | ICD-10-CM | POA: Diagnosis not present

## 2023-09-04 ENCOUNTER — Encounter: Payer: Self-pay | Admitting: Family Medicine

## 2023-09-04 ENCOUNTER — Ambulatory Visit: Payer: Self-pay | Admitting: Family Medicine

## 2023-09-04 VITALS — BP 124/80 | HR 72 | Temp 98.1°F | Ht 67.0 in | Wt 188.0 lb

## 2023-09-04 DIAGNOSIS — T753XXA Motion sickness, initial encounter: Secondary | ICD-10-CM

## 2023-09-04 DIAGNOSIS — I10 Essential (primary) hypertension: Secondary | ICD-10-CM

## 2023-09-04 DIAGNOSIS — Z0000 Encounter for general adult medical examination without abnormal findings: Secondary | ICD-10-CM

## 2023-09-04 DIAGNOSIS — R7301 Impaired fasting glucose: Secondary | ICD-10-CM | POA: Diagnosis not present

## 2023-09-04 DIAGNOSIS — E782 Mixed hyperlipidemia: Secondary | ICD-10-CM

## 2023-09-04 LAB — COMPREHENSIVE METABOLIC PANEL WITH GFR
ALT: 25 U/L (ref 0–35)
AST: 24 U/L (ref 0–37)
Albumin: 4.4 g/dL (ref 3.5–5.2)
Alkaline Phosphatase: 63 U/L (ref 39–117)
BUN: 22 mg/dL (ref 6–23)
CO2: 28 meq/L (ref 19–32)
Calcium: 9.5 mg/dL (ref 8.4–10.5)
Chloride: 105 meq/L (ref 96–112)
Creatinine, Ser: 1.07 mg/dL (ref 0.40–1.20)
GFR: 56.64 mL/min — ABNORMAL LOW (ref >60.00)
Glucose, Bld: 87 mg/dL (ref 70–99)
Potassium: 4.5 meq/L (ref 3.5–5.1)
Sodium: 141 meq/L (ref 135–145)
Total Bilirubin: 0.6 mg/dL (ref 0.2–1.2)
Total Protein: 6.8 g/dL (ref 6.0–8.3)

## 2023-09-04 LAB — CBC
HCT: 39.3 % (ref 36.0–46.0)
Hemoglobin: 13.7 g/dL (ref 12.0–15.0)
MCHC: 34.8 g/dL (ref 30.0–36.0)
MCV: 88.9 fl (ref 78.0–100.0)
Platelets: 271 10*3/uL (ref 150.0–400.0)
RBC: 4.42 Mil/uL (ref 3.87–5.11)
RDW: 13.1 % (ref 11.5–15.5)
WBC: 5.3 10*3/uL (ref 4.0–10.5)

## 2023-09-04 LAB — LIPID PANEL
Cholesterol: 148 mg/dL (ref 0–200)
HDL: 43.2 mg/dL (ref >39.00)
LDL Cholesterol: 86 mg/dL (ref 0–99)
NonHDL: 104.37
Total CHOL/HDL Ratio: 3
Triglycerides: 91 mg/dL (ref 0.0–149.0)
VLDL: 18.2 mg/dL (ref 0.0–40.0)

## 2023-09-04 LAB — TSH: TSH: 1.57 u[IU]/mL (ref 0.35–5.50)

## 2023-09-04 LAB — HEMOGLOBIN A1C: Hgb A1c MFr Bld: 5.3 % (ref 4.6–6.5)

## 2023-09-04 MED ORDER — VALSARTAN-HYDROCHLOROTHIAZIDE 80-12.5 MG PO TABS: 1.0000 | ORAL_TABLET | Freq: Every day | ORAL | 1 refills | Status: DC

## 2023-09-04 MED ORDER — ATORVASTATIN CALCIUM 10 MG PO TABS: 10.0000 mg | ORAL_TABLET | Freq: Every day | ORAL | 3 refills | Status: DC

## 2023-09-04 MED ORDER — SCOPOLAMINE 1 MG/3DAYS TD PT72: 1.0000 | MEDICATED_PATCH | TRANSDERMAL | 0 refills | Status: AC

## 2023-09-04 NOTE — Patient Instructions (Addendum)
 Return in about 25 weeks (around 02/26/2024) for Routine chronic condition follow-up.        Great to see you today.  I have refilled the medication(s) we provide.   If labs were collected or images ordered, we will inform you of  results once we have received them and reviewed. We will contact you either by echart message, or telephone call.  Please give ample time to the testing facility, and our office to run,  receive and review results. Please do not call inquiring of results, even if you can see them in your chart. We will contact you as soon as we are able. If it has been over 1 week since the test was completed, and you have not yet heard from us , then please call us .    - echart message- for normal results that have been seen by the patient already.   - telephone call: abnormal results or if patient has not viewed results in their echart.  If a referral to a specialist was entered for you, please call us  in 2 weeks if you have not heard from the specialist office to schedule.

## 2023-09-04 NOTE — Progress Notes (Signed)
 Patient ID: Lorraine Patton, female  DOB: [birth year], 58 y.o.   MRN: [id number] Patient Care Team    Relationship Specialty Notifications Start End  Catherine Charlies LABOR, DO PCP - General Family Medicine  09/04/23   Cris Burnard DEL, MD Consulting Physician Obstetrics and Gynecology  09/04/23   Chapman Savant, MD Referring Physician Radiology  09/04/23   Specialists, Dig[facility]ive Health  Gastroenterology  09/04/23    Comment: lana lox    Chief Complaint  Patient presents with   Establish Care    Medication management.     Subjective:  Lorraine Patton is a 58 y.o.  female present for new patient establishment CPE/CMC management. All past medical history, surgical history, allergies, family history, immunizations, medications and social history were updated in the electronic medical record today. All recent labs, ED visits and hospitalizations within the last year were reviewed.  Health maintenance:  Colonoscopy: completed 05/19/2014, by digestive health, follow up 10-year. Mammogram: completed: 02/2023, [facility].  By gynecology.  Yeah Cervical cancer screening: last pap: 02/07/2022, results: WNL/negative HPV, completed by: Dr. Cris Immunizations: tdap 1/ 2024, Influenza (encouraged yearly),  zostavax completed Infectious disease screening: HIV completed, Hep C completed Assistive device: None Oxygen use: None Patient has a Dental home. Hospitalizations/ED visits: Reviewed  Hypertension/hyperlipidemia/ Patient reports compliance with valsartan -HCTZ 80-12.5 mg daily.Patient denies chest pain, shortness of breath, dizziness or lower extremity edema.       07/12/2022    2:40 PM 01/11/2022    3:19 PM 10/12/2021   10:02 AM 08/11/2021   11:08 AM 07/01/2021    4:05 PM  Depression screen PHQ 2/9  Decreased Interest 0 0 0 0 0  Down, Depressed, Hopeless 0 0 0 0 0  PHQ - 2 Score 0 0 0 0 0  Altered sleeping    0   Tired, decreased energy    0   Change in appetite    0   Feeling bad or failure  about yourself     0   Trouble concentrating    0   Moving slowly or fidgety/restless    0   Suicidal thoughts    0   PHQ-9 Score    0   Difficult doing work/chores    Not difficult at all       08/11/2021   11:09 AM 07/30/2018    9:03 AM 12/22/2017    8:25 AM  GAD 7 : Generalized Anxiety Score  Nervous, Anxious, on Edge 0 1 0  Control/stop worrying 0 1 0  Worry too much - different things 0 1 0  Trouble relaxing 0 1 0  Restless 0 1 0  Easily annoyed or irritable 0 1 0  Afraid - awful might happen 0 1 0  Total GAD 7 Score 0 7 0  Anxiety Difficulty Not difficult at all Not difficult at all Not difficult at all             07/12/2022    2:40 PM 10/12/2021   10:02 AM 08/11/2021   11:08 AM  Fall Risk   Falls in the past year? 0 0 0  Number falls in past yr: 0 0 0  Injury with Fall? 0 0 0  Risk for fall due to : No Fall Risks No Fall Risks No Fall Risks  Follow up Falls evaluation completed Falls evaluation completed  Falls evaluation completed      Data saved with a previous flowsheet row definition  Immunization History  Administered Date(s) Administered   Hepatitis A, Adult 08/26/2022   Influenza Inj Mdck Quad Pf 12/03/2018   Influenza Split 12/19/2014   Influenza, Seasonal, Injecte, Preservative Fre 12/24/2013, 12/19/2022   Influenza,inj,Quad PF,6+ Mos 12/02/2015, 12/14/2016, 12/22/2017   Influenza-Unspecified 12/30/2010, 12/20/2011, 12/19/2014, 01/06/2020, 01/04/2021   Janssen (J&J) SARS-COV-2 Vaccination 06/24/2019   PFIZER(Purple Top)SARS-COV-2 Vaccination 06/24/2019, 02/05/2020, 07/08/2020   Tdap 03/18/2013, 03/14/2022   Typhoid Inactivated 08/26/2022   Yellow Fever 07/15/2022   Zoster Recombinant(Shingrix) 01/06/2020, 04/16/2020    No results found.  Past Medical History:  Diagnosis Date   Abnormal Pap smear of cervix 1996   Essential (primary) hypertension 08/23/2014   white coat syndrome - needs to bring home cuff for verification at all visits    HLD (hyperlipidemia) 02/15/2014   Menopausal symptoms 07/01/2021   UTI (urinary tract infection)    No Known Allergies Past Surgical History:  Procedure Laterality Date   AUGMENTATION MAMMAPLASTY Bilateral    BREAST ENHANCEMENT SURGERY  2016 and 1997   CRYOTHERAPY  1996   Family History  Problem Relation Age of Onset   Myelodysplastic syndrome Sister    Cancer Sister    HIV/AIDS Brother    Diabetes Maternal Grandmother    Heart disease Maternal Grandfather    Hyperlipidemia Maternal Uncle    Social History   Social History Narrative   Marital status/children/pets: Married, 2 children   Education/employment: Retired   Field seismologist:      -smoke alarm in the home:Yes     - wears seatbelt: Yes     - Feels safe in their relationships: Yes       Allergies as of 09/04/2023   No Known Allergies      Medication List        Accurate as of September 04, 2023  9:13 AM. If you have any questions, ask your nurse or doctor.          STOP taking these medications    FISH OIL MINIS PO Stopped by: Naiyah Klostermann   Zepbound  10 MG/0.5ML Pen Generic drug: tirzepatide  Stopped by: Charlies Bellini       TAKE these medications    atorvastatin  10 MG tablet Commonly known as: LIPITOR Take 1 tablet (10 mg total) by mouth daily.   magnesium 30 MG tablet Take 30 mg by mouth 2 (two) times daily.   multivitamin capsule Take 1 capsule by mouth daily.   PROBIOTIC PO Take by mouth.   scopolamine 1 MG/3DAYS Commonly known as: TRANSDERM-SCOP Place 1 patch (1.5 mg total) onto the skin every 3 (three) days. Started by: Embree Brawley   valsartan -hydrochlorothiazide  80-12.5 MG tablet Commonly known as: DIOVAN -HCT Take 1 tablet by mouth daily.        All past medical history, surgical history, allergies, family history, immunizations andmedications were updated in the EMR today and reviewed under the history and medication portions of their EMR.    No results found for this or any  previous visit (from the past 2160 hours).    ROS 14 pt review of systems performed and negative (unless mentioned in an HPI)  Objective: BP 124/80   Pulse 72   Temp 98.1 F (36.7 C)   Ht 5' 7 (1.702 m)   Wt 188 lb (85.3 kg)   LMP 01/16/2018 (Approximate)   SpO2 96%   BMI 29.44 kg/m  Physical Exam Vitals and nursing note reviewed.  Constitutional:      General: She is not in acute distress.  Appearance: Normal appearance. She is not ill-appearing or toxic-appearing.  HENT:     Head: Normocephalic and atraumatic.     Right Ear: Tympanic membrane, ear canal and external ear normal. There is no impacted cerumen.     Left Ear: Tympanic membrane, ear canal and external ear normal. There is no impacted cerumen.     Nose: No congestion or rhinorrhea.     Mouth/Throat:     Mouth: Mucous membranes are moist.     Pharynx: Oropharynx is clear. No oropharyngeal exudate or posterior oropharyngeal erythema.   Eyes:     General: No scleral icterus.       Right eye: No discharge.        Left eye: No discharge.     Extraocular Movements: Extraocular movements intact.     Conjunctiva/sclera: Conjunctivae normal.     Pupils: Pupils are equal, round, and reactive to light.    Cardiovascular:     Rate and Rhythm: Normal rate and regular rhythm.     Pulses: Normal pulses.     Heart sounds: Normal heart sounds. No murmur heard.    No friction rub. No gallop.  Pulmonary:     Effort: Pulmonary effort is normal. No respiratory distress.     Breath sounds: Normal breath sounds. No stridor. No wheezing, rhonchi or rales.  Chest:     Chest wall: No tenderness.  Abdominal:     General: Abdomen is flat. Bowel sounds are normal. There is no distension.     Palpations: Abdomen is soft. There is no mass.     Tenderness: There is no abdominal tenderness. There is no right CVA tenderness, left CVA tenderness, guarding or rebound.     Hernia: No hernia is present.   Musculoskeletal:         General: No swelling, tenderness or deformity. Normal range of motion.     Cervical back: Normal range of motion and neck supple. No rigidity or tenderness.     Right lower leg: No edema.     Left lower leg: No edema.  Lymphadenopathy:     Cervical: No cervical adenopathy.   Skin:    General: Skin is warm and dry.     Coloration: Skin is not jaundiced or pale.     Findings: No bruising, erythema, lesion or rash.   Neurological:     General: No focal deficit present.     Mental Status: She is alert and oriented to person, place, and time. Mental status is at baseline.     Cranial Nerves: No cranial nerve deficit.     Sensory: No sensory deficit.     Motor: No weakness.     Coordination: Coordination normal.     Gait: Gait normal.     Deep Tendon Reflexes: Reflexes normal.   Psychiatric:        Mood and Affect: Mood normal.        Behavior: Behavior normal.        Thought Content: Thought content normal.        Judgment: Judgment normal.     Assessment/plan: Chyan Carnero is a 58 y.o. female present for Encounter for medical examination to establish care-cpe, Chronic Conditions/illness Management and acute Essential hypertension/hyperlipidemia - CBC - Comp Met (CMET) - TSH - Lipid panel Stable Continue Diovan  80-12.5 mg daily Follow-up every 6 months on chronic conditions.  Elevated fasting glucose - Hemoglobin A1c Motion sickness, initial encounter Scopolamine patches prescribed.  With instruction on proper use.  Routine general medical examination at a health care facility (Primary) Colonoscopy: completed 05/19/2014, by digestive health, follow up 10-year. Mammogram: completed: 02/2023, [facility].  Cervical cancer screening: last pap: 02/07/2022, results: WNL/negative HPV, completed by: Dr. Cris Immunizations: tdap 1/ 2024, Influenza (encouraged yearly),  zostavax completed Infectious disease screening: HIV completed, Hep C completed Patient was encouraged to  exercise greater than 150 minutes a week. Patient was encouraged to choose a diet filled with fresh fruits and vegetables, and lean meats. AVS provided to patient today for education/recommendation on gender specific health and safety maintenance. Follow-up Return in about 25 weeks (around 02/26/2024) for Routine chronic condition follow-up.  Orders Placed This Encounter  Procedures   CBC   Comp Met (CMET)   TSH   Lipid panel   Hemoglobin A1c   Meds ordered this encounter  Medications   valsartan -hydrochlorothiazide  (DIOVAN -HCT) 80-12.5 MG tablet    Sig: Take 1 tablet by mouth daily.    Dispense:  90 tablet    Refill:  1   atorvastatin  (LIPITOR) 10 MG tablet    Sig: Take 1 tablet (10 mg total) by mouth daily.    Dispense:  90 tablet    Refill:  3   scopolamine (TRANSDERM-SCOP) 1 MG/3DAYS    Sig: Place 1 patch (1.5 mg total) onto the skin every 3 (three) days.    Dispense:  4 patch    Refill:  0   Referral Orders  No referral(s) requested today     Note is dictated utilizing voice recognition software. Although note has been proof read prior to signing, occasional typographical errors still can be missed. If any questions arise, please do not hesitate to call for verification.  Electronically signed by: Charlies Bellini, DO [facility] [place]

## 2023-09-05 ENCOUNTER — Ambulatory Visit: Payer: Self-pay | Admitting: Family Medicine

## 2023-09-05 ENCOUNTER — Ambulatory Visit (INDEPENDENT_AMBULATORY_CARE_PROVIDER_SITE_OTHER): Admitting: Obstetrics and Gynecology

## 2023-09-05 ENCOUNTER — Encounter: Payer: Self-pay | Admitting: Obstetrics and Gynecology

## 2023-09-05 VITALS — BP 117/77 | HR 80

## 2023-09-05 DIAGNOSIS — N812 Incomplete uterovaginal prolapse: Secondary | ICD-10-CM | POA: Diagnosis not present

## 2023-09-05 DIAGNOSIS — N8110 Cystocele, unspecified: Secondary | ICD-10-CM

## 2023-09-05 DIAGNOSIS — N3941 Urge incontinence: Secondary | ICD-10-CM

## 2023-09-05 NOTE — Progress Notes (Signed)
 [facility]   Subjective:     Chief Complaint: Pessary fitting Lorraine Patton is a 58 y.o. female is here for pessary fitting.)  History of Present Illness: Lorraine Patton is a 58 y.o. female with stage I pelvic organ prolapse who presents today for a pessary fitting.    Past Medical History: Patient  has a past medical history of Abnormal Pap smear of cervix (1996), Essential (primary) hypertension (08/23/2014), HLD (hyperlipidemia) (02/15/2014), Menopausal symptoms (07/01/2021), and UTI (urinary tract infection).   Past Surgical History: She  has a past surgical history that includes Cryotherapy (1996); Breast enhancement surgery (2016 and 1997); and Augmentation mammaplasty (Bilateral).   Medications: She has a current medication list which includes the following prescription(s): atorvastatin , magnesium, multivitamin, probiotic product, scopolamine, and valsartan -hydrochlorothiazide .   Allergies: Patient has no known allergies.   Social History: Patient  reports that she has never smoked. She has never been exposed to tobacco smoke. She has never used smokeless tobacco. She reports current alcohol use. She reports that she does not use drugs.      Objective:    BP 117/77   Pulse 80   LMP 01/16/2018 (Approximate)  Gen: No apparent distress, A&O x 3. Pelvic Exam: Normal external female genitalia; Bartholin's and Skene's glands normal in appearance; urethral meatus normal in appearance, no urethral masses or discharge.   Attempted a #2 Ring with support that was too small and did not provide support.  A size #4 ring with support pessary ([id number])  was fitted. It was comfortable, stayed in place with valsalva and was an appropriate size on examination, with one finger fitting between the pessary and the vaginal walls. Patient was able to demonstrate proper removal and placement.    Assessment/Plan:    Assessment: Lorraine Patton is a 58 y.o. with stage I pelvic organ  prolapse who presents for a pessary fitting. Plan: She was fitted with a #4 ring with support pessary. She will remove daily. She will use lubricant.   Follow-up in 6 months for a pessary check or sooner as needed.  All questions were answered.    Aarav Burgett G Tahjay Binion, NP

## 2023-09-18 DIAGNOSIS — I83891 Varicose veins of right lower extremities with other complications: Secondary | ICD-10-CM | POA: Diagnosis not present

## 2023-10-23 ENCOUNTER — Other Ambulatory Visit: Payer: Self-pay | Admitting: Medical-Surgical

## 2023-10-23 DIAGNOSIS — N6310 Unspecified lump in the right breast, unspecified quadrant: Secondary | ICD-10-CM

## 2023-10-26 ENCOUNTER — Encounter: Payer: Self-pay | Admitting: Obstetrics

## 2023-10-27 ENCOUNTER — Ambulatory Visit (INDEPENDENT_AMBULATORY_CARE_PROVIDER_SITE_OTHER): Admitting: Obstetrics and Gynecology

## 2023-10-27 ENCOUNTER — Encounter: Payer: Self-pay | Admitting: Obstetrics and Gynecology

## 2023-10-27 ENCOUNTER — Other Ambulatory Visit (HOSPITAL_COMMUNITY)
Admission: RE | Admit: 2023-10-27 | Discharge: 2023-10-27 | Disposition: A | Discharge status: Home / Self Care | Source: Ambulatory Visit | Attending: Obstetrics and Gynecology | Admitting: Obstetrics and Gynecology

## 2023-10-27 VITALS — BP 111/75 | HR 76

## 2023-10-27 DIAGNOSIS — N8110 Cystocele, unspecified: Secondary | ICD-10-CM

## 2023-10-27 DIAGNOSIS — R82998 Other abnormal findings in urine: Secondary | ICD-10-CM

## 2023-10-27 DIAGNOSIS — R319 Hematuria, unspecified: Secondary | ICD-10-CM

## 2023-10-27 DIAGNOSIS — N3941 Urge incontinence: Secondary | ICD-10-CM | POA: Diagnosis not present

## 2023-10-27 DIAGNOSIS — R3989 Other symptoms and signs involving the genitourinary system: Secondary | ICD-10-CM

## 2023-10-27 LAB — URINALYSIS, COMPLETE (UACMP) WITH MICROSCOPIC
Bilirubin Urine: NEGATIVE
Glucose, UA: NEGATIVE mg/dL
Hgb urine dipstick: NEGATIVE
Ketones, ur: NEGATIVE mg/dL
Leukocytes,Ua: NEGATIVE
Nitrite: POSITIVE — AB
Protein, ur: NEGATIVE mg/dL
Specific Gravity, Urine: 1.017 (ref 1.005–1.030)
pH: 6 (ref 5.0–8.0)

## 2023-10-27 LAB — POCT URINALYSIS DIP (CLINITEK)
Bilirubin, UA: NEGATIVE
Glucose, UA: NEGATIVE mg/dL
Ketones, POC UA: NEGATIVE mg/dL
Leukocytes, UA: NEGATIVE
Nitrite, UA: POSITIVE — AB
POC PROTEIN,UA: NEGATIVE
Spec Grav, UA: 1.02 (ref 1.010–1.025)
Urobilinogen, UA: 0.2 U/dL
pH, UA: 6 (ref 5.0–8.0)

## 2023-10-27 MED ORDER — GENTAMICIN SULFATE 40 MG/ML IJ SOLN
80.0000 mg | Freq: Once | INTRAMUSCULAR | Status: AC
  Administered 2023-10-27: 80 mg

## 2023-10-27 MED ORDER — LIDOCAINE HCL 2 % IJ SOLN
20.0000 mL | Freq: Once | INTRAMUSCULAR | Status: AC
  Administered 2023-10-27: 400 mg

## 2023-10-27 MED ORDER — ESTRADIOL 0.1 MG/GM VA CREA: 0.5000 g | TOPICAL_CREAM | VAGINAL | 11 refills | Status: AC

## 2023-10-27 MED ORDER — BUPIVACAINE HCL 0.25 % IJ SOLN
20.0000 mL | Freq: Once | INTRAMUSCULAR | Status: AC
  Administered 2023-10-27: 20 mL

## 2023-10-27 MED ORDER — HEPARIN SODIUM (PORCINE) 10000 UNIT/ML IJ SOLN
10000.0000 [IU] | Freq: Once | INTRAMUSCULAR | Status: AC
  Administered 2023-10-27: 10000 [IU] via INTRAVESICAL

## 2023-10-27 MED ORDER — SODIUM BICARBONATE 8.4 % IV SOLN
5.0000 mL | Freq: Once | INTRAVENOUS | Status: AC
  Administered 2023-10-27: 5 mL

## 2023-10-27 NOTE — Addendum Note (Signed)
 Addended by: Mckenzey Parcell N on: 10/27/2023 03:53 PM   Modules accepted: Orders

## 2023-10-27 NOTE — Progress Notes (Signed)
 [facility] Return Visit  SUBJECTIVE  History of Present Illness: Lorraine Patton is a 58 y.o. female seen for foul smelling urine. She reports she has had multiple rounds of UTI treatment. She states she has not been using her pessary often as she only wishes to use it when needed.   Reports 1 positive culture in [place] that was + for E.coli     Past Medical History: Patient  has a past medical history of Abnormal Pap smear of cervix (1996), Essential (primary) hypertension (08/23/2014), HLD (hyperlipidemia) (02/15/2014), Menopausal symptoms (07/01/2021), and UTI (urinary tract infection).   Past Surgical History: She  has a past surgical history that includes Cryotherapy (1996); Breast enhancement surgery (2016 and 1997); and Augmentation mammaplasty (Bilateral).   Medications: She has a current medication list which includes the following prescription(s): atorvastatin , [START ON 10/30/2023] estradiol , magnesium, multivitamin, probiotic product, scopolamine , and valsartan -hydrochlorothiazide .   Allergies: Patient has no known allergies.   Social History: Patient  reports that she has never smoked. She has never been exposed to tobacco smoke. She has never used smokeless tobacco. She reports current alcohol use. She reports that she does not use drugs.     OBJECTIVE    Lab Results  Component Value Date   COLORU yellow 10/27/2023   CLARITYU clear 10/27/2023   GLUCOSEUR negative 10/27/2023   BILIRUBINUR negative 10/27/2023   KETONESU Negative 01/12/2023   SPECGRAV 1.020 10/27/2023   RBCUR trace-intact (A) 10/27/2023   PHUR 6.0 10/27/2023   PROTEINUR Negative 01/12/2023   UROBILINOGEN 0.2 10/27/2023   LEUKOCYTESUR Negative 10/27/2023   Bladder Instillation: The patient was identified and verbally consented for the procedure.  The urethra was prepped with Betadine x 3. A 16 Fr foley catheter was inserted the bladder and drained for 150 cc. The foley was then attached  to a 60 mL syringe with the plunger removed.  The medication was slowly poured into the bladder via the syringe and foley.  The medication consisted of: 5ml of Gentimicin, 20ml of Lidocaine  2%, 20mL of Bupivicaine 0.25%, 10,000 units/mL Heparin , 5mL Sodium Bicarbonate  8.4%.  The foley was removed and the patient was asked to hold the liquids in her bladder for 30-60 minutes if possible.   Precautions were given and patient was instructed to call the office or on-call number for any concerns.  Petrina Melby G Niranjan Rufener, NP   Physical Exam: Vitals:   10/27/23 1107  BP: 111/75  Pulse: 76   Gen: No apparent distress, A&O x 3.  Detailed Urogynecologic Evaluation:  No obvious sign of bleeding or irritation at urethra or vaginal opening. No internal exam done.    ASSESSMENT AND PLAN    Lorraine Patton is a 58 y.o. with:  1. Pelvic organ prolapse quantification stage 1 cystocele   2. Urge incontinence   3. Leukocytes in urine   4. Hematuria, unspecified type    Patient to use the pessary for a few days to see if this helps her empty her bladder.  Not as concerning at this time.  Bladder instillation with Gentamicin  done today and will send urine for culture. Also will start patient on vaginal estrogen cream for UTI prevention.  Will send urine for micro.   Patient to return PRN    Lolamae Voisin G Victorious Kundinger, NP

## 2023-10-27 NOTE — Addendum Note (Signed)
 Addended by: Willadean Guyton N on: 10/27/2023 04:04 PM   Modules accepted: Orders

## 2023-10-27 NOTE — Patient Instructions (Signed)
 Use the estrogen cream nightly for 2 weeks and then twice weekly after the initial two weeks.   We will send your urine for culture and then call if you need further treatment.

## 2023-10-29 ENCOUNTER — Ambulatory Visit: Payer: Self-pay | Admitting: Obstetrics and Gynecology

## 2023-10-29 NOTE — Progress Notes (Signed)
 Awaiting culture results, treated with Gent bladder instillation on Friday.

## 2023-10-30 LAB — URINE CULTURE: Culture: >=100000 — AB

## 2023-10-31 NOTE — Telephone Encounter (Signed)
 Patient states she is feeling much better and will reach out if she develops any symptoms.

## 2023-11-01 ENCOUNTER — Ambulatory Visit: Admitting: Family Medicine

## 2023-12-04 DIAGNOSIS — H0288A Meibomian gland dysfunction right eye, upper and lower eyelids: Secondary | ICD-10-CM | POA: Diagnosis not present

## 2023-12-04 DIAGNOSIS — H0288B Meibomian gland dysfunction left eye, upper and lower eyelids: Secondary | ICD-10-CM | POA: Diagnosis not present

## 2024-02-26 ENCOUNTER — Ambulatory Visit (INDEPENDENT_AMBULATORY_CARE_PROVIDER_SITE_OTHER): Admitting: Family Medicine

## 2024-02-26 ENCOUNTER — Encounter: Payer: Self-pay | Admitting: Family Medicine

## 2024-02-26 VITALS — BP 118/78 | HR 83 | Temp 97.9°F | Wt 185.8 lb

## 2024-02-26 DIAGNOSIS — I10 Essential (primary) hypertension: Secondary | ICD-10-CM

## 2024-02-26 DIAGNOSIS — E782 Mixed hyperlipidemia: Secondary | ICD-10-CM

## 2024-02-26 MED ORDER — VALSARTAN-HYDROCHLOROTHIAZIDE 80-12.5 MG PO TABS: 1.0000 | ORAL_TABLET | Freq: Every day | ORAL | 1 refills | Status: AC

## 2024-02-26 MED ORDER — ATORVASTATIN CALCIUM 10 MG PO TABS: 10.0000 mg | ORAL_TABLET | Freq: Every day | ORAL | 3 refills | Status: AC

## 2024-02-26 NOTE — Progress Notes (Signed)
 Patient ID: Lorraine Patton, female  DOB: 1965-04-10, 58 y.o.   MRN: [id number] Patient Care Team    Relationship Specialty Notifications Start End  Catherine Charlies LABOR, DO PCP - General Family Medicine  09/04/23   Cris Burnard DEL, MD Consulting Physician Obstetrics and Gynecology  09/04/23   Chapman Savant, MD Referring Physician Radiology  09/04/23   [facility]  09/04/23    Comment: lana lox    Chief Complaint  Patient presents with   Hypertension    Subjective:  Lorraine Patton is a 58 y.o.  female present for [facility] management. All past medical history, surgical history, allergies, family history, immunizations, medications and social history were updated in the electronic medical record today. All recent labs, ED visits and hospitalizations within the last year were reviewed.  Hypertension/hyperlipidemia Patient reports compliance with valsartan -HCTZ 80-12.5 mg daily. Patient denies chest pain, shortness of breath, dizziness or lower extremity edema.      09/04/2023   11:04 AM 07/12/2022    2:40 PM 01/11/2022    3:19 PM 10/12/2021   10:02 AM 08/11/2021   11:08 AM  Depression screen PHQ 2/9  Decreased Interest 0 0 0 0 0  Down, Depressed, Hopeless 0 0 0 0 0  PHQ - 2 Score 0 0 0 0 0  Altered sleeping     0  Tired, decreased energy     0  Change in appetite     0  Feeling bad or failure about yourself      0  Trouble concentrating     0  Moving slowly or fidgety/restless     0  Suicidal thoughts     0  PHQ-9 Score     0   Difficult doing work/chores     Not difficult at all     Data saved with a previous flowsheet row definition           09/04/2023   11:03 AM 07/12/2022    2:40 PM 10/12/2021   10:02 AM 08/11/2021   11:08 AM  Fall Risk   Falls in the past year? 0 0 0 0  Number falls in past yr: 0 0 0 0  Injury with Fall? 0  0  0  0   Risk for fall due to : No Fall Risks No Fall Risks No Fall Risks No Fall Risks  Follow up Falls evaluation  completed Falls evaluation completed Falls evaluation completed  Falls evaluation completed      Data saved with a previous flowsheet row definition     Immunization History  Administered Date(s) Administered   Hepatitis A, Adult 08/26/2022   Influenza Inj Mdck Quad Pf 12/03/2018   Influenza Split 12/19/2014   Influenza, Seasonal, Injecte, Preservative Fre 12/24/2013, 12/19/2022   Influenza,inj,Quad PF,6+ Mos 12/02/2015, 12/14/2016, 12/22/2017   Influenza-Unspecified 12/30/2010, 12/20/2011, 12/19/2014, 01/06/2020, 01/04/2021, 12/13/2023   Janssen (J&J) SARS-COV-2 Vaccination 06/24/2019   PFIZER(Purple Top)SARS-COV-2 Vaccination 06/24/2019, 02/05/2020, 07/08/2020   Tdap 03/18/2013, 03/14/2022   Typhoid Inactivated 08/26/2022   Yellow Fever 07/15/2022   Zoster Recombinant(Shingrix) 01/06/2020, 04/16/2020    No results found.  Past Medical History:  Diagnosis Date   Abnormal Pap smear of cervix 1996   Essential (primary) hypertension 08/23/2014   white coat syndrome - needs to bring home cuff for verification at all visits   HLD (hyperlipidemia) 02/15/2014   Menopausal symptoms 07/01/2021   UTI (urinary tract infection)    No Known Allergies Past Surgical  History:  Procedure Laterality Date   AUGMENTATION MAMMAPLASTY Bilateral    BREAST ENHANCEMENT SURGERY  2016 and 1997   CRYOTHERAPY  1996   Family History  Problem Relation Age of Onset   Heart disease Mother    Asthma Mother    Diabetes Mother    Early death Father    Myelodysplastic syndrome Sister    Cancer Sister    HIV/AIDS Brother    Diabetes Maternal Grandmother    Heart disease Maternal Grandfather    Hyperlipidemia Maternal Uncle    Social History   Social History Narrative   Marital status/children/pets: Married, 2 children   Education/employment: Retired   Field Seismologist:      -smoke alarm in the home:Yes     - wears seatbelt: Yes     - Feels safe in their relationships: Yes       Allergies as of  02/26/2024   No Known Allergies      Medication List        Accurate as of February 26, 2024  9:07 AM. If you have any questions, ask your nurse or doctor.          atorvastatin  10 MG tablet Commonly known as: LIPITOR Take 1 tablet (10 mg total) by mouth daily.   estradiol  0.1 MG/GM vaginal cream Commonly known as: ESTRACE  Place 0.5 g vaginally 2 (two) times a week. Place 0.5g nightly for two weeks then twice a week after   magnesium 30 MG tablet Take 30 mg by mouth 2 (two) times daily.   multivitamin capsule Take 1 capsule by mouth daily.   PROBIOTIC PO Take by mouth.   scopolamine  1 MG/3DAYS Commonly known as: TRANSDERM-SCOP Place 1 patch (1.5 mg total) onto the skin every 3 (three) days.   valsartan -hydrochlorothiazide  80-12.5 MG tablet Commonly known as: DIOVAN -HCT Take 1 tablet by mouth daily.        All past medical history, surgical history, allergies, family history, immunizations andmedications were updated in the EMR today and reviewed under the history and medication portions of their EMR.    No results found for this or any previous visit (from the past 2160 hours).    Review of Systems  Constitutional: Negative.   HENT: Negative.    Eyes: Negative.   Respiratory: Negative.    Cardiovascular: Negative.   Gastrointestinal: Negative.   Genitourinary: Negative.   Musculoskeletal: Negative.   Skin: Negative.   Neurological: Negative.   Endo/Heme/Allergies: Negative.   Psychiatric/Behavioral: Negative.    All other systems reviewed and are negative.  14 pt review of systems performed and negative (unless mentioned in an HPI)  Objective: BP 118/78   Pulse 83   Temp 97.9 F (36.6 C)   Wt 185 lb 12.8 oz (84.3 kg)   LMP 01/16/2018   SpO2 98%   BMI 29.10 kg/m  Physical Exam Vitals and nursing note reviewed.  Constitutional:      General: She is not in acute distress.    Appearance: Normal appearance. She is not ill-appearing,  toxic-appearing or diaphoretic.  HENT:     Head: Normocephalic and atraumatic.  Eyes:     General: No scleral icterus.       Right eye: No discharge.        Left eye: No discharge.     Extraocular Movements: Extraocular movements intact.     Conjunctiva/sclera: Conjunctivae normal.     Pupils: Pupils are equal, round, and reactive to light.  Cardiovascular:  Rate and Rhythm: Normal rate and regular rhythm.     Heart sounds: No murmur heard. Pulmonary:     Effort: Pulmonary effort is normal. No respiratory distress.     Breath sounds: Normal breath sounds. No wheezing, rhonchi or rales.  Musculoskeletal:     Right lower leg: No edema.     Left lower leg: No edema.  Skin:    General: Skin is warm.     Findings: No rash.  Neurological:     Mental Status: She is alert and oriented to person, place, and time. Mental status is at baseline.     Motor: No weakness.     Gait: Gait normal.  Psychiatric:        Mood and Affect: Mood normal.        Behavior: Behavior normal.        Thought Content: Thought content normal.        Judgment: Judgment normal.     Assessment/plan: Lorraine Patton is a 58 y.o. female present for Chronic Conditions/illness Management Essential hypertension/hyperlipidemia Stable Continue Diovan  80-12.5 mg daily Low sodium diet  Return in about 6 months (around 09/04/2024) for cpe (20 min), Routine chronic condition follow-up.  No orders of the defined types were placed in this encounter.  Meds ordered this encounter  Medications   valsartan -hydrochlorothiazide  (DIOVAN -HCT) 80-12.5 MG tablet    Sig: Take 1 tablet by mouth daily.    Dispense:  90 tablet    Refill:  1   atorvastatin  (LIPITOR) 10 MG tablet    Sig: Take 1 tablet (10 mg total) by mouth daily.    Dispense:  90 tablet    Refill:  3   Referral Orders  No referral(s) requested today     Note is dictated utilizing voice recognition software. Although note has been proof read prior to  signing, occasional typographical errors still can be missed. If any questions arise, please do not hesitate to call for verification.  Electronically signed by: Charlies Bellini, DO [facility]

## 2024-02-26 NOTE — Patient Instructions (Signed)

## 2024-03-11 ENCOUNTER — Ambulatory Visit
Admission: RE | Admit: 2024-03-11 | Discharge: 2024-03-11 | Disposition: A | Source: Ambulatory Visit | Attending: Medical-Surgical

## 2024-03-11 ENCOUNTER — Ambulatory Visit: Payer: Self-pay | Admitting: Medical-Surgical

## 2024-03-11 DIAGNOSIS — N6320 Unspecified lump in the left breast, unspecified quadrant: Secondary | ICD-10-CM

## 2024-03-25 ENCOUNTER — Encounter: Payer: Self-pay | Admitting: *Deleted

## 2024-09-17 ENCOUNTER — Encounter: Admitting: Family Medicine
